# Patient Record
Sex: Female | Born: 1990 | Race: Black or African American | Hispanic: No | Marital: Single | Smoking: Never smoker
Health system: Southern US, Community
[De-identification: ages and names within clinical notes are randomized; demographics above are authoritative.]

## PROBLEM LIST (undated history)

## (undated) DIAGNOSIS — E079 Disorder of thyroid, unspecified: Secondary | ICD-10-CM

## (undated) HISTORY — PX: TONSILLECTOMY: SUR1361

---

## 2013-07-19 HISTORY — PX: LAPAROSCOPY WITH TUBAL LIGATION: SHX5576

## 2015-08-16 ENCOUNTER — Emergency Department
Admission: EM | Admit: 2015-08-16 | Discharge: 2015-08-16 | Disposition: A | Discharge status: Home / Self Care | Payer: Medicaid Other | Attending: Emergency Medicine | Admitting: Emergency Medicine

## 2015-08-16 ENCOUNTER — Encounter: Payer: Self-pay | Admitting: Emergency Medicine

## 2015-08-16 ENCOUNTER — Emergency Department: Payer: Medicaid Other

## 2015-08-16 DIAGNOSIS — F17200 Nicotine dependence, unspecified, uncomplicated: Secondary | ICD-10-CM | POA: Diagnosis not present

## 2015-08-16 DIAGNOSIS — N939 Abnormal uterine and vaginal bleeding, unspecified: Secondary | ICD-10-CM

## 2015-08-16 DIAGNOSIS — Z3490 Encounter for supervision of normal pregnancy, unspecified, unspecified trimester: Secondary | ICD-10-CM

## 2015-08-16 DIAGNOSIS — O209 Hemorrhage in early pregnancy, unspecified: Secondary | ICD-10-CM | POA: Insufficient documentation

## 2015-08-16 DIAGNOSIS — Z3A00 Weeks of gestation of pregnancy not specified: Secondary | ICD-10-CM | POA: Insufficient documentation

## 2015-08-16 HISTORY — DX: Disorder of thyroid, unspecified: E07.9

## 2015-08-16 LAB — COMPREHENSIVE METABOLIC PANEL
ALT: 13 U/L — ABNORMAL LOW (ref 14–54)
ANION GAP: 10 (ref 5–15)
AST: 18 U/L (ref 15–41)
Albumin: 4.2 g/dL (ref 3.5–5.0)
Alkaline Phosphatase: 47 U/L (ref 38–126)
BUN: 7 mg/dL (ref 6–20)
CHLORIDE: 107 mmol/L (ref 101–111)
CO2: 23 mmol/L (ref 22–32)
Calcium: 9.2 mg/dL (ref 8.9–10.3)
Creatinine, Ser: 0.41 mg/dL — ABNORMAL LOW (ref 0.44–1.00)
GFR calc Af Amer: >60 mL/min (ref >60)
Glucose, Bld: 88 mg/dL (ref 65–99)
POTASSIUM: 3.6 mmol/L (ref 3.5–5.1)
Sodium: 140 mmol/L (ref 135–145)
Total Bilirubin: 1 mg/dL (ref 0.3–1.2)
Total Protein: 7.2 g/dL (ref 6.5–8.1)

## 2015-08-16 LAB — CBC
HCT: 41.3 % (ref 35.0–47.0)
Hemoglobin: 14.1 g/dL (ref 12.0–16.0)
MCH: 30.5 pg (ref 26.0–34.0)
MCHC: 34.1 g/dL (ref 32.0–36.0)
MCV: 89.4 fL (ref 80.0–100.0)
PLATELETS: 151 10*3/uL (ref 150–440)
RBC: 4.62 MIL/uL (ref 3.80–5.20)
RDW: 13 % (ref 11.5–14.5)
WBC: 7.1 10*3/uL (ref 3.6–11.0)

## 2015-08-16 LAB — HCG, QUANTITATIVE, PREGNANCY: HCG, BETA CHAIN, QUANT, S: 678 m[IU]/mL — AB (ref <5)

## 2015-08-16 LAB — ABO/RH: ABO/RH(D): B POS

## 2015-08-16 LAB — POCT PREGNANCY, URINE: PREG TEST UR: POSITIVE — AB

## 2015-08-16 NOTE — ED Notes (Signed)
Reports her period is past due and is having very dark bloody discharge.  States her tubes are tied.

## 2015-08-16 NOTE — ED Provider Notes (Signed)
[facility] Emergency Department Provider Note  Time seen: 9:01 AM  I have reviewed the triage vital signs and the nursing notes.   HISTORY  Chief Complaint Vaginal Bleeding    HPI Vickie Francis is a 25 y.o. female with no past medical history who presents to the emergency department with dark vaginal bleeding. According to the patient her period was approximately one week late, it is normally dark and then transition to a brighter red color. Patient states it is been very dark blood with clots during this period. She denies any pain, abdominal pain, vaginal pain, vaginal discharge. Is in a monogamous relationship but denies any possibility of STD. Patient had her tubes tied approximately 2 years ago. She has had 5 children. Denies fever, dysuria.     Past Medical History  Diagnosis Date  . Thyroid disease     There are no active problems to display for this patient.   Past Surgical History  Procedure Laterality Date  . Tonsillectomy      No current outpatient prescriptions on file.  Allergies Morphine and related  History reviewed. No pertinent family history.  Social History Social History  Substance Use Topics  . Smoking status: Current Every Day Smoker  . Smokeless tobacco: None  . Alcohol Use: None    Review of Systems Constitutional: Negative for fever. Cardiovascular: Negative for chest pain. Respiratory: Negative for shortness of breath. Gastrointestinal: Negative for abdominal pain Genitourinary: Negative for dysuria. Positive for dark vaginal bleeding Musculoskeletal: Negative for back pain Neurological: Negative for headache 10-point ROS otherwise negative.  ____________________________________________   PHYSICAL EXAM:  VITAL SIGNS: ED Triage Vitals  Enc Vitals Group     BP 08/16/15 0844 126/74 mmHg     Pulse Rate 08/16/15 0844 83     Resp 08/16/15 0844 16     Temp 08/16/15 0844 98.2 F (36.8 C)     Temp Source  08/16/15 0844 Oral     SpO2 08/16/15 0844 100 %     Weight 08/16/15 0844 126 lb (57.153 kg)     Height 08/16/15 0844 5' (1.524 m)     Head Cir --      Peak Flow --      Pain Score --      Pain Loc --      Pain Edu? --      Excl. in GC? --     Constitutional: Alert and oriented. Well appearing and in no distress. Eyes: Normal exam ENT   Head: Normocephalic and atraumatic.   Mouth/Throat: Mucous membranes are moist. Cardiovascular: Normal rate, regular rhythm. No murmur Respiratory: Normal respiratory effort without tachypnea nor retractions. Breath sounds are clear  Gastrointestinal: Soft and nontender. No distention.  Musculoskeletal: Nontender with normal range of motion in all extremities. Neurologic:  Normal speech and language. No gross focal neurologic deficits Skin:  Skin is warm, dry and intact.  Psychiatric: Mood and affect are normal. Speech and behavior are normal.  ____________________________________________    INITIAL IMPRESSION / ASSESSMENT AND PLAN / ED COURSE  Pertinent labs & imaging results that were available during my care of the patient were reviewed by me and considered in my medical decision making (see chart for details).  Patient presents with dark or vaginal bleeding for the past week, which was approximately one week late. Patient denies any discomfort, states the only reason she came for evaluation was because of the darker color of the bleeding which is atypical for her. We  will perform a pelvic exam, check a urine pregnancy test. Patient agreeable to plan.  Urine pregnancy test positive 2. We'll obtain blood work to confirm.  Labs show an elevated beta hCG of 678. Patient's pelvic exam shows a small amount of dark bleeding. No clots. No adnexal or cervical motion tenderness. Labs are otherwise within normal limits including H&H. We will obtain an ultrasound help further evaluate and then consult OB/GYN.  Patient's ultrasound showed no IUP.  I discussed the patient with Dr. Jean Rosenthal of [facility], he states have the patient follow-up on Monday in the office for repeat beta ECG testing. I discussed this with the patient as well as strict return precautions for any abdominal pain, and the patient is agreeable.  ____________________________________________   FINAL CLINICAL IMPRESSION(S) / ED DIAGNOSES  Pregnancy Vaginal bleeding    Minna Antis, MD 08/16/15 1355

## 2015-08-16 NOTE — Discharge Instructions (Signed)
You have been seen in the emergency department today for vaginal bleeding and a possible pregnancy. As we discussed it is very important he follow-up with Dr. Jean Rosenthal on Monday in the office for repeat beta hCG. Her beta hCG today is 675. Please return to the emergency department immediately if you develop any abdominal pain, or any other symptom personally concerning to your self.   Dysfunctional Uterine Bleeding Dysfunctional uterine bleeding is abnormal bleeding from the uterus. Dysfunctional uterine bleeding includes:  A period that comes earlier or later than usual.  A period that is lighter, heavier, or has blood clots.  Bleeding between periods.  Skipping one or more periods.  Bleeding after sexual intercourse.  Bleeding after menopause. HOME CARE INSTRUCTIONS  Pay attention to any changes in your symptoms. Follow these instructions to help with your condition: Eating  Eat well-balanced meals. Include foods that are high in iron, such as liver, meat, shellfish, green leafy vegetables, and eggs.  If you become constipated:  Drink plenty of water.  Eat fruits and vegetables that are high in water and fiber, such as spinach, carrots, raspberries, apples, and mango. Medicines  Take over-the-counter and prescription medicines only as told by your health care provider.  Do not change medicines without talking with your health care provider.  Aspirin or medicines that contain aspirin may make the bleeding worse. Do not take those medicines:  During the week before your period.  During your period.  If you were prescribed iron pills, take them as told by your health care provider. Iron pills help to replace iron that your body loses because of this condition. Activity  If you need to change your sanitary pad or tampon more than one time every 2 hours:  Lie in bed with your feet raised (elevated).  Place a cold pack on your lower abdomen.  Rest as much as possible until  the bleeding stops or slows down.  Do not try to lose weight until the bleeding has stopped and your blood iron level is back to normal. Other Instructions  For two months, write down:  When your period starts.  When your period ends.  When any abnormal bleeding occurs.  What problems you notice.  Keep all follow up visits as told by your health care provider. This is important. SEEK MEDICAL CARE IF:  You get light-headed or weak.  You have nausea and vomiting.  You cannot eat or drink without vomiting.  You feel dizzy or have diarrhea while you are taking medicines.  You are taking birth control pills or hormones, and you want to change them or stop taking them. SEEK IMMEDIATE MEDICAL CARE IF:  You develop a fever or chills.  You need to change your sanitary pad or tampon more than one time per hour.  Your bleeding becomes heavier, or your flow contains clots more often.  You develop pain in your abdomen.  You lose consciousness.  You develop a rash.   This information is not intended to replace advice given to you by your health care provider. Make sure you discuss any questions you have with your health care provider.   Document Released: 07/02/2000 Document Revised: 03/26/2015 Document Reviewed: 09/30/2014 Elsevier Interactive Patient Education [employer]  First Trimester of Pregnancy The first trimester of pregnancy is from week 1 until the end of week 12 (months 1 through 3). During this time, your baby will begin to develop inside you. At 6-8 weeks, the eyes and face are  formed, and the heartbeat can be seen on ultrasound. At the end of 12 weeks, all the baby's organs are formed. Prenatal care is all the medical care you receive before the birth of your baby. Make sure you get good prenatal care and follow all of your doctor's instructions. HOME CARE  Medicines  Take medicine only as told by your doctor. Some medicines are safe and some are not  during pregnancy.  Take your prenatal vitamins as told by your doctor.  Take medicine that helps you poop (stool softener) as needed if your doctor says it is okay. Diet  Eat regular, healthy meals.  Your doctor will tell you the amount of weight gain that is right for you.  Avoid raw meat and uncooked cheese.  If you feel sick to your stomach (nauseous) or throw up (vomit):  Eat 4 or 5 small meals a day instead of 3 large meals.  Try eating a few soda crackers.  Drink liquids between meals instead of during meals.  If you have a hard time pooping (constipation):  Eat high-fiber foods like fresh vegetables, fruit, and whole grains.  Drink enough fluids to keep your pee (urine) clear or pale yellow. Activity and Exercise  Exercise only as told by your doctor. Stop exercising if you have cramps or pain in your lower belly (abdomen) or low back.  Try to avoid standing for long periods of time. Move your legs often if you must stand in one place for a long time.  Avoid heavy lifting.  Wear low-heeled shoes. Sit and stand up straight.  You can have sex unless your doctor tells you not to. Relief of Pain or Discomfort  Wear a good support bra if your breasts are sore.  Take warm water baths (sitz baths) to soothe pain or discomfort caused by hemorrhoids. Use hemorrhoid cream if your doctor says it is okay.  Rest with your legs raised if you have leg cramps or low back pain.  Wear support hose if you have puffy, bulging veins (varicose veins) in your legs. Raise (elevate) your feet for 15 minutes, 3-4 times a day. Limit salt in your diet. Prenatal Care  Schedule your prenatal visits by the twelfth week of pregnancy.  Write down your questions. Take them to your prenatal visits.  Keep all your prenatal visits as told by your doctor. Safety  Wear your seat belt at all times when driving.  Make a list of emergency phone numbers. The list should include numbers for  family, friends, the hospital, and police and fire departments. General Tips  Ask your doctor for a referral to a local prenatal class. Begin classes no later than at the start of month 6 of your pregnancy.  Ask for help if you need counseling or help with nutrition. Your doctor can give you advice or tell you where to go for help.  Do not use hot tubs, steam rooms, or saunas.  Do not douche or use tampons or scented sanitary pads.  Do not cross your legs for long periods of time.  Avoid litter boxes and soil used by cats.  Avoid all smoking, herbs, and alcohol. Avoid drugs not approved by your doctor.  Do not use any tobacco products, including cigarettes, chewing tobacco, and electronic cigarettes. If you need help quitting, ask your doctor. You may get counseling or other support to help you quit.  Visit your dentist. At home, brush your teeth with a soft toothbrush. Be gentle when you  floss. GET HELP IF:  You are dizzy.  You have mild cramps or pressure in your lower belly.  You have a nagging pain in your belly area.  You continue to feel sick to your stomach, throw up, or have watery poop (diarrhea).  You have a bad smelling fluid coming from your vagina.  You have pain with peeing (urination).  You have increased puffiness (swelling) in your face, hands, legs, or ankles. GET HELP RIGHT AWAY IF:   You have a fever.  You are leaking fluid from your vagina.  You have spotting or bleeding from your vagina.  You have very bad belly cramping or pain.  You gain or lose weight rapidly.  You throw up blood. It may look like coffee grounds.  You are around people who have [place] measles, fifth disease, or chickenpox.  You have a very bad headache.  You have shortness of breath.  You have any kind of trauma, such as from a fall or a car accident.   This information is not intended to replace advice given to you by your health care provider. Make sure you discuss  any questions you have with your health care provider.   Document Released: 12/22/2007 Document Revised: 07/26/2014 Document Reviewed: 05/15/2013 Elsevier Interactive Patient Education [employer]

## 2015-08-16 NOTE — ED Notes (Signed)
Pt transported to ultrasound.

## 2015-08-19 ENCOUNTER — Telehealth: Payer: Self-pay | Admitting: Emergency Medicine

## 2015-08-19 NOTE — ED Notes (Signed)
Pt called and was concerned that she did not get appt with [facility] until Thursday.  i called the office and they will send a message to the md to ask if pt needs to come in for serum hcg level sooner than that.  They willc all the pt.  i called the pt and explained that the doctors will review her information, including her history of btl and willcall her back.

## 2015-08-21 ENCOUNTER — Encounter: Payer: Self-pay | Admitting: Obstetrics and Gynecology

## 2015-08-24 ENCOUNTER — Emergency Department
Admission: EM | Admit: 2015-08-24 | Discharge: 2015-08-25 | Disposition: A | Discharge status: Home / Self Care | Payer: Medicaid Other | Source: Home / Self Care | Attending: Emergency Medicine | Admitting: Emergency Medicine

## 2015-08-24 ENCOUNTER — Encounter: Payer: Self-pay | Admitting: *Deleted

## 2015-08-24 DIAGNOSIS — Z792 Long term (current) use of antibiotics: Secondary | ICD-10-CM | POA: Insufficient documentation

## 2015-08-24 DIAGNOSIS — O99331 Smoking (tobacco) complicating pregnancy, first trimester: Secondary | ICD-10-CM | POA: Insufficient documentation

## 2015-08-24 DIAGNOSIS — R079 Chest pain, unspecified: Secondary | ICD-10-CM

## 2015-08-24 DIAGNOSIS — O080 Genital tract and pelvic infection following ectopic and molar pregnancy: Secondary | ICD-10-CM | POA: Insufficient documentation

## 2015-08-24 DIAGNOSIS — N719 Inflammatory disease of uterus, unspecified: Secondary | ICD-10-CM

## 2015-08-24 DIAGNOSIS — R1030 Lower abdominal pain, unspecified: Secondary | ICD-10-CM

## 2015-08-24 DIAGNOSIS — O210 Mild hyperemesis gravidarum: Secondary | ICD-10-CM

## 2015-08-24 DIAGNOSIS — Z79899 Other long term (current) drug therapy: Secondary | ICD-10-CM | POA: Diagnosis not present

## 2015-08-24 DIAGNOSIS — O001 Tubal pregnancy without intrauterine pregnancy: Secondary | ICD-10-CM | POA: Diagnosis not present

## 2015-08-24 DIAGNOSIS — K661 Hemoperitoneum: Secondary | ICD-10-CM | POA: Diagnosis not present

## 2015-08-24 DIAGNOSIS — F17200 Nicotine dependence, unspecified, uncomplicated: Secondary | ICD-10-CM

## 2015-08-24 DIAGNOSIS — R109 Unspecified abdominal pain: Secondary | ICD-10-CM | POA: Diagnosis present

## 2015-08-24 DIAGNOSIS — Z885 Allergy status to narcotic agent status: Secondary | ICD-10-CM | POA: Diagnosis not present

## 2015-08-24 DIAGNOSIS — O009 Unspecified ectopic pregnancy without intrauterine pregnancy: Secondary | ICD-10-CM

## 2015-08-24 DIAGNOSIS — E079 Disorder of thyroid, unspecified: Secondary | ICD-10-CM | POA: Diagnosis not present

## 2015-08-24 LAB — COMPREHENSIVE METABOLIC PANEL
ALBUMIN: 4.2 g/dL (ref 3.5–5.0)
ALT: 14 U/L (ref 14–54)
AST: 18 U/L (ref 15–41)
Alkaline Phosphatase: 49 U/L (ref 38–126)
Anion gap: 7 (ref 5–15)
BILIRUBIN TOTAL: 0.7 mg/dL (ref 0.3–1.2)
BUN: 10 mg/dL (ref 6–20)
CHLORIDE: 105 mmol/L (ref 101–111)
CO2: 28 mmol/L (ref 22–32)
CREATININE: 0.58 mg/dL (ref 0.44–1.00)
Calcium: 8.9 mg/dL (ref 8.9–10.3)
GFR calc Af Amer: >60 mL/min (ref >60)
GFR calc non Af Amer: >60 mL/min (ref >60)
GLUCOSE: 91 mg/dL (ref 65–99)
POTASSIUM: 3.5 mmol/L (ref 3.5–5.1)
Sodium: 140 mmol/L (ref 135–145)
TOTAL PROTEIN: 6.8 g/dL (ref 6.5–8.1)

## 2015-08-24 LAB — LIPASE, BLOOD: Lipase: 30 U/L (ref 11–51)

## 2015-08-24 LAB — URINALYSIS COMPLETE WITH MICROSCOPIC (ARMC ONLY)
BACTERIA UA: NONE SEEN
Bilirubin Urine: NEGATIVE
Glucose, UA: NEGATIVE mg/dL
HGB URINE DIPSTICK: NEGATIVE
KETONES UR: NEGATIVE mg/dL
Leukocytes, UA: NEGATIVE
Nitrite: NEGATIVE
Protein, ur: 30 mg/dL — AB
SPECIFIC GRAVITY, URINE: 1.026 (ref 1.005–1.030)
pH: 7 (ref 5.0–8.0)

## 2015-08-24 LAB — CBC
HEMATOCRIT: 39.4 % (ref 35.0–47.0)
HEMOGLOBIN: 13.5 g/dL (ref 12.0–16.0)
MCH: 31 pg (ref 26.0–34.0)
MCHC: 34.1 g/dL (ref 32.0–36.0)
MCV: 90.9 fL (ref 80.0–100.0)
Platelets: 187 10*3/uL (ref 150–440)
RBC: 4.34 MIL/uL (ref 3.80–5.20)
RDW: 13.3 % (ref 11.5–14.5)
WBC: 7.9 10*3/uL (ref 3.6–11.0)

## 2015-08-24 NOTE — ED Provider Notes (Signed)
[facility] Emergency Department Provider Note  ____________________________________________  Time seen: Approximately 11:51 PM  I have reviewed the triage vital signs and the nursing notes.   HISTORY  Chief Complaint Abdominal Pain and Dysuria    HPI Vickie Francis is a 25 y.o. female who comes into the hospital today with abdominal pain. The patient reports that started tonight down at the bottom of her abdomen.The patient reports that she has not taken anything for the pain. She reports is not cramping but feels tight and like this pulling. The patient was recently seen here for bleeding and a miscarriage. The patient reports that when she was seen here her pregnancy hormone was 675 when she saw [facility] last week she was told it was 0. She reports that she thinks she had completed the miscarriage as she had passed a large clot on 2022/12/12. She reports that her bleeding stopped today. She reports that she has mid abdomen and left sided abdominal pain. She rates her pain a 9 out of 10 in intensity. She has had some nausea and some vomiting but denies any fevers or chills. The patient reports that today she did have some chest pain while at work but no shortness of breath. She reports she's never had the chest pain in the past. The patient decided to come in and get checked out when the pain continued and was not improving.   Past Medical History  Diagnosis Date  . Thyroid disease     There are no active problems to display for this patient.   Past Surgical History  Procedure Laterality Date  . Tonsillectomy      Current Outpatient Rx  Name  Route  Sig  Dispense  Refill  . clindamycin (CLEOCIN) 300 MG capsule   Oral   Take 1 capsule (300 mg total) by mouth 3 (three) times daily.   30 capsule   0   . oxyCODONE-acetaminophen (ROXICET) 5-325 MG tablet   Oral   Take 1 tablet by mouth every 6 (six) hours as needed.   12 tablet   0      Allergies Morphine and related  History reviewed. No pertinent family history.  Social History Social History  Substance Use Topics  . Smoking status: Current Every Day Smoker  . Smokeless tobacco: None  . Alcohol Use: None    Review of Systems Constitutional: No fever/chills Eyes: No visual changes. ENT: No sore throat. Cardiovascular:  chest pain. Respiratory: Denies shortness of breath. Gastrointestinal: abdominal pain, nausea, vomiting.  No diarrhea.  No constipation. Genitourinary: Negative for dysuria. Musculoskeletal: Negative for back pain. Skin: Negative for rash. Neurological: Negative for headaches, focal weakness or numbness.  10-point ROS otherwise negative.  ____________________________________________   PHYSICAL EXAM:  VITAL SIGNS: ED Triage Vitals  Enc Vitals Group     BP 08/24/15 2245 117/89 mmHg     Pulse Rate 08/24/15 2245 93     Resp 08/24/15 2245 20     Temp 08/24/15 2245 98 F (36.7 C)     Temp Source 08/24/15 2245 Oral     SpO2 08/24/15 2245 99 %     Weight 08/24/15 2245 124 lb (56.246 kg)     Height 08/24/15 2245 5' (1.524 m)     Head Cir --      Peak Flow --      Pain Score 08/24/15 2246 9     Pain Loc --      Pain Edu? --  Excl. in GC? --     Constitutional: Alert and oriented. Well appearing and in moderate distress. Eyes: Conjunctivae are normal. PERRL. EOMI. Head: Atraumatic. Nose: No congestion/rhinnorhea. Mouth/Throat: Mucous membranes are moist.  Oropharynx non-erythematous. Cardiovascular: Normal rate, regular rhythm. Grossly normal heart sounds.  Good peripheral circulation. Respiratory: Normal respiratory effort.  No retractions. Lungs CTAB. Gastrointestinal: Soft with lower abd tenderness to palpation. No distention. Positive bowel sounds Genitourinary: normal external genitalia, scant blood in vault, firm non tender cervix, left adnexal and uterine tenderness to palpation Musculoskeletal: No lower extremity  tenderness nor edema.   Neurologic:  Normal speech and language.  Skin:  Skin is warm, dry and intact.  Psychiatric: Mood and affect are normal.   ____________________________________________   LABS (all labs ordered are listed, but only abnormal results are displayed)  Labs Reviewed  WET PREP, GENITAL - Abnormal; Notable for the following:    WBC, Wet Prep HPF POC MANY (*)    All other components within normal limits  HCG, QUANTITATIVE, PREGNANCY - Abnormal; Notable for the following:    hCG, Beta Chain, Quant, S 1312 (*)    All other components within normal limits  URINALYSIS COMPLETEWITH MICROSCOPIC ([facility] ONLY) - Abnormal; Notable for the following:    Color, Urine YELLOW (*)    APPearance CLEAR (*)    Protein, ur 30 (*)    Squamous Epithelial / LPF 0-5 (*)    All other components within normal limits  CHLAMYDIA/NGC RT PCR ([facility] ONLY)  LIPASE, BLOOD  COMPREHENSIVE METABOLIC PANEL  CBC  TROPONIN I   ____________________________________________  EKG  ED ECG REPORT I, Rebecka Apley, the attending physician, personally viewed and interpreted this ECG.   Date: 08/25/2015  EKG Time: 0025  Rate: 73  Rhythm: normal sinus rhythm  Axis: normal  Intervals:none  ST&T Change: none  ____________________________________________  RADIOLOGY  US pelvis: No intrauterine gestational sac seen, no evidence for ectopic pregnancy, this is unusual given the quantitative beta hCG level of 1312. Would follow-up quantitative beta hCG level. ____________________________________________   PROCEDURES  Procedure(s) performed: None  Critical Care performed: No  ____________________________________________   INITIAL IMPRESSION / ASSESSMENT AND PLAN / ED COURSE  Pertinent labs & imaging results that were available during my care of the patient were reviewed by me and considered in my medical decision making (see chart for details).  This is a 25 year old female who  comes in to the hospital today with abdominal pain. The patient did receive some Percocet for her pain. The patient did have an elevated quantitative beta hCG level which was higher than it was previously. After receiving the results of her ultrasound and blood work I contacted Dr. Bonney Aid who noted that the patient's quantitative beta hCG when checked previously was less than 1. He is concerned that there may have been a lab error so he feels the patient needs to follow back up in the office tomorrow to be able to determine if she needs methotrexate. I did talk about the patient's many bacteria as well as possibly giving her some clindamycin for endometritis and he supports that decision. The patient will be discharged home to follow-up with OB/GYN. ____________________________________________   FINAL CLINICAL IMPRESSION(S) / ED DIAGNOSES  Final diagnoses:  Lower abdominal pain  Endometritis  Ectopic pregnancy      Rebecka Apley, MD 08/25/15 [id number]

## 2015-08-24 NOTE — ED Notes (Addendum)
Sudden onset of severe pelvic pain. Pt vomiting x 2. Pt c/o dysuria. Pt recently treated for miscarriage, complicated hx of unexpected pregnancy. Pt had bilateral tubal ligation 09/2013. Pt found to be pregnant 08/16/15. Pt f/u w/ [facility] on 08/21/15. Pt passed what she describes as a large clot that morning prior to f/u at [facility].

## 2015-08-25 ENCOUNTER — Observation Stay
Admission: AD | Admit: 2015-08-25 | Discharge: 2015-08-25 | Disposition: A | Discharge status: Home / Self Care | Payer: Medicaid Other | Source: Ambulatory Visit | Attending: Obstetrics and Gynecology | Admitting: Obstetrics and Gynecology

## 2015-08-25 ENCOUNTER — Encounter
Admission: AD | Disposition: A | Discharge status: Home / Self Care | Payer: Self-pay | Source: Ambulatory Visit | Attending: Obstetrics and Gynecology

## 2015-08-25 ENCOUNTER — Observation Stay: Payer: Medicaid Other | Admitting: Anesthesiology

## 2015-08-25 ENCOUNTER — Emergency Department: Payer: Medicaid Other

## 2015-08-25 ENCOUNTER — Ambulatory Visit: Admit: 2015-08-25 | Payer: Self-pay | Admitting: Obstetrics and Gynecology

## 2015-08-25 DIAGNOSIS — Z885 Allergy status to narcotic agent status: Secondary | ICD-10-CM | POA: Insufficient documentation

## 2015-08-25 DIAGNOSIS — Z79899 Other long term (current) drug therapy: Secondary | ICD-10-CM | POA: Insufficient documentation

## 2015-08-25 DIAGNOSIS — E079 Disorder of thyroid, unspecified: Secondary | ICD-10-CM | POA: Insufficient documentation

## 2015-08-25 DIAGNOSIS — F17200 Nicotine dependence, unspecified, uncomplicated: Secondary | ICD-10-CM | POA: Insufficient documentation

## 2015-08-25 DIAGNOSIS — R109 Unspecified abdominal pain: Secondary | ICD-10-CM | POA: Insufficient documentation

## 2015-08-25 DIAGNOSIS — K661 Hemoperitoneum: Secondary | ICD-10-CM | POA: Insufficient documentation

## 2015-08-25 DIAGNOSIS — O001 Tubal pregnancy without intrauterine pregnancy: Principal | ICD-10-CM | POA: Insufficient documentation

## 2015-08-25 DIAGNOSIS — O3680X0 Pregnancy with inconclusive fetal viability, not applicable or unspecified: Secondary | ICD-10-CM

## 2015-08-25 HISTORY — PX: LAPAROSCOPIC BILATERAL SALPINGECTOMY: SHX5889

## 2015-08-25 HISTORY — PX: DILATION AND CURETTAGE OF UTERUS: SHX78

## 2015-08-25 LAB — CBC
HEMATOCRIT: 39.3 % (ref 35.0–47.0)
HEMOGLOBIN: 13.3 g/dL (ref 12.0–16.0)
MCH: 30.6 pg (ref 26.0–34.0)
MCHC: 33.8 g/dL (ref 32.0–36.0)
MCV: 90.5 fL (ref 80.0–100.0)
Platelets: 183 10*3/uL (ref 150–440)
RBC: 4.34 MIL/uL (ref 3.80–5.20)
RDW: 13.3 % (ref 11.5–14.5)
WBC: 6.5 10*3/uL (ref 3.6–11.0)

## 2015-08-25 LAB — T4, FREE: FREE T4: 0.96 ng/dL (ref 0.61–1.12)

## 2015-08-25 LAB — TROPONIN I

## 2015-08-25 LAB — TYPE AND SCREEN
ABO/RH(D): B POS
Antibody Screen: NEGATIVE

## 2015-08-25 LAB — WET PREP, GENITAL
Clue Cells Wet Prep HPF POC: NONE SEEN
SPERM: NONE SEEN
TRICH WET PREP: NONE SEEN
YEAST WET PREP: NONE SEEN

## 2015-08-25 LAB — CHLAMYDIA/NGC RT PCR (ARMC ONLY)
CHLAMYDIA TR: NOT DETECTED
N GONORRHOEAE: NOT DETECTED

## 2015-08-25 LAB — TSH: TSH: 0.811 u[IU]/mL (ref 0.350–4.500)

## 2015-08-25 LAB — HCG, QUANTITATIVE, PREGNANCY
HCG, BETA CHAIN, QUANT, S: 1591 m[IU]/mL — AB (ref <5)
hCG, Beta Chain, Quant, S: 1312 m[IU]/mL — ABNORMAL HIGH (ref <5)

## 2015-08-25 SURGERY — DILATION AND CURETTAGE
Anesthesia: General | Wound class: Clean Contaminated

## 2015-08-25 MED ORDER — FENTANYL CITRATE (PF) 100 MCG/2ML IJ SOLN
INTRAMUSCULAR | Status: AC
  Administered 2015-08-25: 25 ug via INTRAVENOUS
  Filled 2015-08-25: qty 2

## 2015-08-25 MED ORDER — CLINDAMYCIN HCL 300 MG PO CAPS: 300.0000 mg | ORAL_CAPSULE | Freq: Three times a day (TID) | ORAL | Status: DC

## 2015-08-25 MED ORDER — ACETAMINOPHEN 500 MG PO TABS
1000.0000 mg | ORAL_TABLET | Freq: Four times a day (QID) | ORAL | Status: DC | PRN
Start: 2015-08-25 — End: 2015-08-26

## 2015-08-25 MED ORDER — OXYCODONE-ACETAMINOPHEN 5-325 MG PO TABS
1.0000 | ORAL_TABLET | Freq: Four times a day (QID) | ORAL | Status: DC | PRN
  Administered 2015-08-25: 1 via ORAL
  Filled 2015-08-25: qty 1

## 2015-08-25 MED ORDER — DEXAMETHASONE SODIUM PHOSPHATE 10 MG/ML IJ SOLN
INTRAMUSCULAR | Status: DC | PRN
  Administered 2015-08-25: 10 mg via INTRAVENOUS

## 2015-08-25 MED ORDER — DEXTROSE-NACL 5-0.45 % IV SOLN
INTRAVENOUS | Status: DC
  Administered 2015-08-25: 10:00:00 via INTRAVENOUS

## 2015-08-25 MED ORDER — SIMETHICONE 80 MG PO CHEW: 80.0000 mg | CHEWABLE_TABLET | Freq: Four times a day (QID) | ORAL | Status: DC | PRN

## 2015-08-25 MED ORDER — LACTATED RINGERS IV SOLN
INTRAVENOUS | Status: DC | PRN
  Administered 2015-08-25: 13:00:00 via INTRAVENOUS

## 2015-08-25 MED ORDER — HYDROMORPHONE HCL 1 MG/ML IJ SOLN: 0.5000 mg | INTRAMUSCULAR | Status: DC | PRN

## 2015-08-25 MED ORDER — SODIUM CHLORIDE 0.9 % IV SOLN: INTRAVENOUS | Status: DC

## 2015-08-25 MED ORDER — ONDANSETRON HCL 4 MG/2ML IJ SOLN: 4.0000 mg | Freq: Four times a day (QID) | INTRAMUSCULAR | Status: DC | PRN

## 2015-08-25 MED ORDER — BUPIVACAINE HCL 0.5 % IJ SOLN
INTRAMUSCULAR | Status: DC | PRN
  Administered 2015-08-25: 10 mL

## 2015-08-25 MED ORDER — DOCUSATE SODIUM 100 MG PO CAPS: 100.0000 mg | ORAL_CAPSULE | Freq: Two times a day (BID) | ORAL | Status: AC

## 2015-08-25 MED ORDER — BUPIVACAINE HCL (PF) 0.5 % IJ SOLN
INTRAMUSCULAR | Status: AC
  Filled 2015-08-25: qty 30

## 2015-08-25 MED ORDER — DOXYCYCLINE HYCLATE 100 MG IV SOLR
100.0000 mg | Freq: Once | INTRAVENOUS | Status: AC
  Administered 2015-08-25: 100 mg via INTRAVENOUS
  Filled 2015-08-25: qty 100

## 2015-08-25 MED ORDER — MIDAZOLAM HCL 2 MG/2ML IJ SOLN
INTRAMUSCULAR | Status: DC | PRN
  Administered 2015-08-25: 2 mg via INTRAVENOUS

## 2015-08-25 MED ORDER — LIDOCAINE HCL (CARDIAC) 20 MG/ML IV SOLN
INTRAVENOUS | Status: DC | PRN
  Administered 2015-08-25: 30 mg via INTRAVENOUS

## 2015-08-25 MED ORDER — CLINDAMYCIN HCL 150 MG PO CAPS
300.0000 mg | ORAL_CAPSULE | Freq: Once | ORAL | Status: AC
  Administered 2015-08-25: 300 mg via ORAL
  Filled 2015-08-25: qty 2

## 2015-08-25 MED ORDER — SILVER NITRATE-POT NITRATE 75-25 % EX MISC
CUTANEOUS | Status: AC
  Filled 2015-08-25: qty 7

## 2015-08-25 MED ORDER — FENTANYL CITRATE (PF) 100 MCG/2ML IJ SOLN
25.0000 ug | INTRAMUSCULAR | Status: DC | PRN
  Administered 2015-08-25 (×4): 25 ug via INTRAVENOUS

## 2015-08-25 MED ORDER — ONDANSETRON HCL 4 MG PO TABS: 4.0000 mg | ORAL_TABLET | Freq: Four times a day (QID) | ORAL | Status: DC | PRN

## 2015-08-25 MED ORDER — SUGAMMADEX SODIUM 200 MG/2ML IV SOLN
INTRAVENOUS | Status: DC | PRN
  Administered 2015-08-25: 112.4 mg via INTRAVENOUS

## 2015-08-25 MED ORDER — KETOROLAC TROMETHAMINE 30 MG/ML IJ SOLN
INTRAMUSCULAR | Status: DC | PRN
  Administered 2015-08-25: 30 mg via INTRAVENOUS

## 2015-08-25 MED ORDER — PROPOFOL 10 MG/ML IV BOLUS
INTRAVENOUS | Status: DC | PRN
  Administered 2015-08-25: 150 mg via INTRAVENOUS

## 2015-08-25 MED ORDER — SILVER NITRATE-POT NITRATE 75-25 % EX MISC
CUTANEOUS | Status: DC | PRN
  Administered 2015-08-25: 7

## 2015-08-25 MED ORDER — ONDANSETRON HCL 4 MG/2ML IJ SOLN
4.0000 mg | Freq: Four times a day (QID) | INTRAMUSCULAR | Status: DC | PRN
Start: 2015-08-25 — End: 2015-08-25

## 2015-08-25 MED ORDER — FENTANYL CITRATE (PF) 100 MCG/2ML IJ SOLN
INTRAMUSCULAR | Status: DC | PRN
  Administered 2015-08-25 (×2): 50 ug via INTRAVENOUS

## 2015-08-25 MED ORDER — ONDANSETRON HCL 4 MG/2ML IJ SOLN
INTRAMUSCULAR | Status: AC
  Administered 2015-08-25: 4 mg via INTRAVENOUS
  Filled 2015-08-25: qty 2

## 2015-08-25 MED ORDER — ONDANSETRON HCL 4 MG/2ML IJ SOLN
INTRAMUSCULAR | Status: DC | PRN
  Administered 2015-08-25: 4 mg via INTRAVENOUS

## 2015-08-25 MED ORDER — OXYCODONE-ACETAMINOPHEN 5-325 MG PO TABS: 1.0000 | ORAL_TABLET | Freq: Four times a day (QID) | ORAL | Status: AC | PRN

## 2015-08-25 MED ORDER — ROCURONIUM BROMIDE 100 MG/10ML IV SOLN
INTRAVENOUS | Status: DC | PRN
  Administered 2015-08-25: 20 mg via INTRAVENOUS
  Administered 2015-08-25: 30 mg via INTRAVENOUS

## 2015-08-25 MED ORDER — LIDOCAINE HCL (PF) 1 % IJ SOLN
INTRAMUSCULAR | Status: AC
  Filled 2015-08-25: qty 30

## 2015-08-25 MED ORDER — OXYCODONE-ACETAMINOPHEN 5-325 MG PO TABS
1.0000 | ORAL_TABLET | Freq: Once | ORAL | Status: AC
  Administered 2015-08-25: 1 via ORAL
  Filled 2015-08-25: qty 1

## 2015-08-25 MED ORDER — ONDANSETRON HCL 4 MG/2ML IJ SOLN
4.0000 mg | Freq: Once | INTRAMUSCULAR | Status: AC | PRN
  Administered 2015-08-25: 4 mg via INTRAVENOUS

## 2015-08-25 MED ORDER — INFLUENZA VAC SPLIT QUAD 0.5 ML IM SUSY: 0.5000 mL | PREFILLED_SYRINGE | INTRAMUSCULAR | Status: DC

## 2015-08-25 SURGICAL SUPPLY — 56 items
APPLICATOR COTTON TIP 6IN STRL (MISCELLANEOUS) ×4 IMPLANT
BAG URO DRAIN 2000ML W/SPOUT (MISCELLANEOUS) ×4 IMPLANT
BLADE SURG SZ11 CARB STEEL (BLADE) ×4 IMPLANT
CANISTER SUCT 1200ML W/VALVE (MISCELLANEOUS) ×4 IMPLANT
CATH FOLEY 2WAY  5CC 16FR (CATHETERS) ×2
CATH ROBINSON RED A/P 16FR (CATHETERS) IMPLANT
CATH URTH 16FR FL 2W BLN LF (CATHETERS) ×2 IMPLANT
CHLORAPREP W/TINT 26ML (MISCELLANEOUS) ×4 IMPLANT
CORD MONOPOLAR M/FML 12FT (MISCELLANEOUS) ×4 IMPLANT
DRAPE LEGGINS SURG 28X43 STRL (DRAPES) ×4 IMPLANT
DRESSING SURGICEL FIBRLLR 1X2 (HEMOSTASIS) IMPLANT
DRSG SURGICEL FIBRILLAR 1X2 (HEMOSTASIS)
DRSG TEGADERM 2-3/8X2-3/4 SM (GAUZE/BANDAGES/DRESSINGS) ×4 IMPLANT
ELECT REM PT RETURN 9FT ADLT (ELECTROSURGICAL) ×4
ELECTRODE REM PT RTRN 9FT ADLT (ELECTROSURGICAL) ×2 IMPLANT
GAUZE SPONGE NON-WVN 2X2 STRL (MISCELLANEOUS) ×2 IMPLANT
GLOVE BIO SURGEON STRL SZ7 (GLOVE) ×12 IMPLANT
GLOVE BIOGEL PI IND STRL 7.5 (GLOVE) ×4 IMPLANT
GLOVE BIOGEL PI INDICATOR 7.5 (GLOVE) ×4
GLOVE INDICATOR 7.5 STRL GRN (GLOVE) ×8 IMPLANT
GOWN STRL REUS W/ TWL LRG LVL3 (GOWN DISPOSABLE) ×2 IMPLANT
GOWN STRL REUS W/ TWL XL LVL3 (GOWN DISPOSABLE) ×2 IMPLANT
GOWN STRL REUS W/TWL LRG LVL3 (GOWN DISPOSABLE) ×2
GOWN STRL REUS W/TWL XL LVL3 (GOWN DISPOSABLE) ×2
IRRIGATION STRYKERFLOW (MISCELLANEOUS) ×2 IMPLANT
IRRIGATOR STRYKERFLOW (MISCELLANEOUS) ×4
IV LACTATED RINGERS 1000ML (IV SOLUTION) ×4 IMPLANT
KIT PINK PAD W/HEAD ARE REST (MISCELLANEOUS) ×4
KIT PINK PAD W/HEAD ARM REST (MISCELLANEOUS) ×2 IMPLANT
KIT RM TURNOVER CYSTO AR (KITS) ×4 IMPLANT
LABEL OR SOLS (LABEL) ×4 IMPLANT
LIGASURE BLUNT 5MM 37CM (INSTRUMENTS) ×4 IMPLANT
LIGASURE MARYLAND LAP STAND (ELECTROSURGICAL) IMPLANT
LIQUID BAND (GAUZE/BANDAGES/DRESSINGS) ×4 IMPLANT
NEEDLE SPNL 22GX3.5 QUINCKE BK (NEEDLE) ×4 IMPLANT
NS IRRIG 500ML POUR BTL (IV SOLUTION) ×4 IMPLANT
PACK DNC HYST (MISCELLANEOUS) ×4 IMPLANT
PACK GYN LAPAROSCOPIC (MISCELLANEOUS) ×4 IMPLANT
PAD OB MATERNITY 4.3X12.25 (PERSONAL CARE ITEMS) ×4 IMPLANT
PAD PREP 24X41 OB/GYN DISP (PERSONAL CARE ITEMS) ×4 IMPLANT
POUCH ENDO CATCH 10MM SPEC (MISCELLANEOUS) ×4 IMPLANT
SCISSORS METZENBAUM CVD 33 (INSTRUMENTS) ×4 IMPLANT
SLEEVE ENDOPATH XCEL 5M (ENDOMECHANICALS) ×4 IMPLANT
SPONGE VERSALON 2X2 STRL (MISCELLANEOUS) ×2
SPONGE XRAY 4X4 16PLY STRL (MISCELLANEOUS) ×4 IMPLANT
SUT MNCRL 4-0 (SUTURE)
SUT MNCRL 4-0 27XMFL (SUTURE)
SUT PLAIN 3 0 SH 27IN (SUTURE) ×4 IMPLANT
SUT VICRYL 0 AB UR-6 (SUTURE) ×4 IMPLANT
SUTURE MNCRL 4-0 27XMF (SUTURE) IMPLANT
SYR CONTROL 10ML (SYRINGE) ×4 IMPLANT
SYRINGE 10CC LL (SYRINGE) ×4 IMPLANT
TOWEL OR 17X26 4PK STRL BLUE (TOWEL DISPOSABLE) ×4 IMPLANT
TROCAR BLUNT TIP 12MM OMST12BT (TROCAR) ×4 IMPLANT
TROCAR XCEL NON-BLD 5MMX100MML (ENDOMECHANICALS) ×4 IMPLANT
TUBING INSUFFLATOR HI FLOW (MISCELLANEOUS) ×4 IMPLANT

## 2015-08-25 NOTE — Anesthesia Procedure Notes (Signed)
Procedure Name: Intubation Date/Time: 08/25/2015 1:12 PM Performed by: Omer Jack Pre-anesthesia Checklist: Patient identified, Patient being monitored, Timeout performed, Emergency Drugs available and Suction available Patient Re-evaluated:Patient Re-evaluated prior to inductionOxygen Delivery Method: Circle system utilized Preoxygenation: Pre-oxygenation with 100% oxygen Intubation Type: IV induction Ventilation: Mask ventilation without difficulty Laryngoscope Size: Mac and 3 Grade View: Grade I Tube type: Oral Tube size: 7.0 mm Number of attempts: 1 Placement Confirmation: ETT inserted through vocal cords under direct vision,  positive ETCO2 and breath sounds checked- equal and bilateral Secured at: 21 cm Tube secured with: Tape Dental Injury: Teeth and Oropharynx as per pre-operative assessment

## 2015-08-25 NOTE — Op Note (Signed)
Operative Note   08/25/2015  PRE-OP DIAGNOSIS: Abdominal pain. Abnormal rising beta hcg values. Concern for ectopic pregnancy. Pelvic free fluid on ultrasound. History of BTL   POST-OP DIAGNOSIS: Same. Dilated and congested right fallopian tube. Hemoperitoneum  SURGEON: Surgeon(s) and Role:    * Tolley Bing, MD - Primary  ASSISTANT: None  ANESTHESIA: General and local  PROCEDURE: Dilation and curettage, diagnostic laparoscopy, bilateral salpingectomy  ESTIMATED BLOOD LOSS: 50mL  DRAINS: indwelling foley UOP   TOTAL IV FLUIDS: crystalloid  SPECIMENS: endometrial curettings and bilateral tubes to pathology   VTE PROPHYLAXIS: SCDs to the bilateral lower extremities  ANTIBIOTICS: Doxycycline  IV given pre op  COMPLICATIONS: None  DISPOSITION: PACU - hemodynamically stable.  CONDITION: stable  FINDINGS: Exam under anesthesia revealed a small anteverted uterus. The patient's uterus sounded to 8.5cm. Endometrial curettings had a minimal amount of tissue but showed a gritty texture in all four quadrants at the end of the curettage. EGBUS, vaginal vault and cervix were normal.  On diagnostic laparoscopy, there was approximately 40mL of hemoperitoneum in the lower pelvis.  In the right adnexa, there was a dilated and congested appearing fallopian tube with no gross evidence of rupture. On this right side there was, approximately 2cm from the cornua, what appeared to be flattened/atrophic part of the tube consistent with prior BTL. On the left side, normal appearing tube with also a segment approximately 2cm from the cornua that appeared flattened/atrophic consistent with prior BTL; there was also normal appearing ovaries bilaterally and normal appearing uterus.  Also, normal appearing appendix, liver and stomach edge, no intra abdominal adhesions noted.   PROCEDURE IN DETAIL: The patient was taken to the OR where anesthesia was administed. The patient was positioned in  dorsal lithotomy in the Walker stirrups. The patient was then examined under anesthesia with the above noted findings. The patient was prepped and draped in the normal sterile fashion and foley catheter was placed. A Graves speculum was placed in the vagina and the anterior lip of the cervix was grasped with a single toothed tenaculum and the uterus sounded to 8.5cm and curettage was done without needed to dilate with any dilators, with the above noted findings. Next, a Hulka uterine manipulator was then inserted in the uterus and uterine mobility was found to be satisfactory; the speculum was then removed.  After changing gloves, attention was turned to the patient's abdomen where a 10 mm skin incision was made in the umbilical fold, after injection of local anesthesia. Using the open technique, the abdomen was then entered and a 12mm balloon trocar placed, with the camera introduced noting correct placement. Pneumoperitoneum to was then achieved and the area directly below entry was inspected and no injury noted. The patient was then placed into trendelenburg and a 5mm LLQ and suprapubic port were then placed under direct visualization.   Using the ligasure device, the mesosalpinx under the left tube was serially cauterized and cut. The cornua was then cauterized and a pedicle left before transection and separation from the uterus. This removed from the abdomen and the same procedure done on the right side. The hemoperitoneum was then removed with the suction irrigator and the intra abdominal pressure turned down to and the operative pedicles were noted to be hemostatic. The 5mm ports were removed under direct visualization and the gas was released prior to removal of the balloon trocar. The fascia there was then closed with a figure of eight 0 vicryl stitch, the  subcutaneous above it reapproximated with a figure of eight 2-0 plain gut stitch and the skin closed with 4-0 monocryl in a subcutaneous  fashion and after holding of pressure due to it being somewhat oozy.While this was being done the Hulka was removed and the speculum re inserted and silver nitrate applied to the former hulka and tenaculum sites.  The umbilicus was hemostatic and liquiband applied and then liquidband was applied to the two 5mm port sites.   The foley catheter was removed. The patient tolerated the procedure well. All counts were correct x 2. The patient was transferred to the recovery room awake, alert and breathing independently.  Cornelia Copa MD [facility]  Pager: [phone]

## 2015-08-25 NOTE — H&P (Addendum)
Obstetrics & Gynecology Admission H&P   Date of Admission : 08/25/2015   Primary OBGYN: [facility] Primary Care Provider: [facility]  Reason for Admission: abnormal pregnancy and concern for ectopic  History of Present Illness: Ms. Vickie Francis is a 25 y.o. G8P5. (Patient's last menstrual period was 07/01/2015 (approximate).), with the above CC. PMHx is significant for h/o 2015 interval BTL and ? hyperthyrodism.  Patient initially seen in the ER on 1/28 and had quant of 678 and negative u/s and was discharged to home after consultation with Dr. Jean Rosenthal of GYN. She had an in office beta hcg drawn on 2/1 which was <1 and she was seen on 2/2 by me and d/w her completion of SAB but need for another means of contraception. She was discharged with PRN follow up and pt to consider options and let me know how she'd like to proceed. She came back to the ER last night with abdominal pain and slight bleeding and her quant here was 1312. Her u/s was still negative but she did have some more free fluid in the pelvis (vs trace previously) and her ES was thinner at 2.92mm vs 13mm on 1/28. She was d/w Dr. Bonney Aid and told to follow up in the office. The [facility] front desk called me at 8am today saying the patient was calling the office for an appointment and after looking up her info in EPIC I recommended to the patient to come to [facility] for direct admission and surgery.  The pain has been helped by the percocet that she was discharged to home with (and also clinda for concern for endometritis) but she feels that it's coming back. It's located in the suprapubic and LLQ area and feels like pulling. No VB, chest pain, SOB, fevers, chills, dysuria.  Last PO before MN    ROS:  as stated in the above HPI.  OBGYN History: As per HPI. SVD x 5.   Past Medical History: Past Medical History  Diagnosis Date  . Thyroid disease     ?hyper    Past Surgical History: Past Surgical History  Procedure Laterality Date   . Tonsillectomy    . Laparoscopy with tubal ligation  2015    kleppinger per patient    Family History:  History reviewed. No pertinent family history. She denies any female cancers  Social History:  Social History   Social History  . Marital Status: Single    Spouse Name: N/A  . Number of Children: N/A  . Years of Education: N/A   Occupational History  . Not on file.   Social History Main Topics  . Smoking status: Current Every Day Smoker  . Smokeless tobacco: Not on file  . Alcohol Use: Not on file  . Drug Use: Not on file  . Sexual Activity: Not on file   Other Topics Concern  . Not on file   Social History Narrative     Allergy: Allergies  Allergen Reactions  . Morphine And Related Hives    Current Outpatient Medications: Prescriptions prior to admission  Medication Sig Dispense Refill Last Dose  . clindamycin (CLEOCIN) 300 MG capsule Take 1 capsule (300 mg total) by mouth 3 (three) times daily. 30 capsule 0   . oxyCODONE-acetaminophen (ROXICET) 5-325 MG tablet Take 1 tablet by mouth every 6 (six) hours as needed. 12 tablet 0      Hospital Medications: Current Facility-Administered Medications  Medication Dose Route Frequency Provider Last Rate Last Dose  . dextrose 5 %-  0.45 % sodium chloride infusion   Intravenous Continuous [place] Bing, MD 100 mL/hr at 08/25/15 1015    . HYDROmorphone (DILAUDID) injection 0.5 mg  0.5 mg Intravenous Q2H PRN [place] Bing, MD      . Melene Muller ON 08/26/2015] Influenza vac split quadrivalent PF (FLUARIX) injection 0.5 mL  0.5 mL Intramuscular Tomorrow-1000 [place] Bing, MD      . ondansetron (ZOFRAN) injection 4 mg  4 mg Intravenous Q6H PRN [place] Bing, MD         Physical Exam:  Current Vital Signs 24h Vital Sign Ranges  T 98.4 F (36.9 C) Temp  Avg: 98.2 F (36.8 C)  Min: 98 F (36.7 C)  Max: 98.4 F (36.9 C)  BP (!) 105/49 mmHg BP  Min: 98/65  Max: 117/89  HR (!) 59 Pulse  Avg: 76.7  Min: 59  Max: 93   RR 18 Resp  Avg: 18.7  Min: 18  Max: 20  SaO2     SpO2  Avg: 99 %  Min: 99 %  Max: 99 %       24 Hour I/O Current Shift I/O  Time Ins Outs       General appearance: Well nourished, well developed female in no acute distress.  Cardiovascular:Regular rate and rhythm.  No murmurs, rubs or gallops. Respiratory:  Clear to auscultation bilateral. Normal respiratory effort Abdomen: positive bowel sounds and no masses, hernias; minimally ttp in the lower belly and llq but no peritoneal s/s Neuro/Psych:  Normal mood and affect.  Skin:  Warm and dry.    Pelvic exam: negative on ER exam last night   Laboratory:  Recent Labs Lab 08/24/15 2255 08/25/15 0937  WBC 7.9 6.5  HGB 13.5 13.3  HCT 39.4 39.3  PLT 187 183    Recent Labs Lab 08/24/15 2255  NA 140  K 3.5  CL 105  CO2 28  BUN 10  CREATININE 0.58  CALCIUM 8.9  PROT 6.8  BILITOT 0.7  ALKPHOS 49  ALT 14  AST 18  GLUCOSE 91    Imaging:  As per HPI  Assessment: Ms. Vickie Francis is a 25 y.o. G8P5 with failed BTL and abnormal pregnancy and concern for ectopic; pt is currently stable  Plan: *admit to gyn for obs *abnormal pregnancy: i d/w her that over the course of 8-9 days, she's only had a rise of her beta from 678 to 1312 and that this would indicate that it's an abnormal pregnancy either IUP or ectopic b/c I'd expect her beta in to be in the 3-4k range. Couple this with her history and I recommend proceeding to surgery for D&C and l/s bilateral salpingectomy. Pt amenable to this and r/b/a d/w her and theoretical risk of slightly earlier menopause. I will call the OR and post and proceed after labs are back. Will also repeat quant and cbc and get routine type and screen -I d/w [facility] this AM and they are re running the office sample. *Endo: pt hasn't seen a PCP in several years for her hyperthyroidism so will get TFTs today *FEN/GI: NPO, MIVF *PPx: OOB ad lib *Pain: IV dialudid PRN. Pt unsure of any other narcotics that may  not cause hives. Fentanyl, dilaudid mentioned and pt unsure *Dispo: pt told that if all goes well that can go home after surgery  Total time taking care of the patient was 25 minutes, with greater than 50% of the time spent in face to face interaction with the patient.  Billey Gosling  Lonia Skinner MD [facility] Pager [phone]

## 2015-08-25 NOTE — Progress Notes (Signed)
Pt discharged home with family. Pt v/o full understanding of dis inst.

## 2015-08-25 NOTE — Discharge Instructions (Signed)
[facility] Laparoscopic Surgery Discharge Instructions  Instructions Following Laparoscopic Surgery You have just undergone a major laparoscopic surgery.  The following list should answer your most common questions.  Although we will discuss your surgery and post-operative instructions with you prior to your discharge, this list will serve as a reminder if you fail to recall the details of what we discussed.  We will discuss your surgery once again in detail at your post-op visit in two to four weeks. If you havent already done so, please call to make your appointment as soon as possible.  How you will feel: Although you have just undergone a major surgery, your recovery will be significantly shorter since the surgery was performed through much smaller incisions than the traditional approach.  You should feel slightly better each day.  If you suddenly feel much worse than the prior day, please call the clinic.  Its important during the early part of your recovery that you maintain some activity.  Walking is encouraged.  You will quicken your recovery by continued activity.  Incision:  Your incisions will be closed with dissolvable stitches or surgical adhesive (glue).  There may be Band-aids and/or Steri-strips covering your incisions.  If there is no drainage from the incisions you may remove the Band-aids in one to two days.  You may notice some minor bruising at the incision sites.  This is common and will resolve within several days.  Please inform us if the redness at the edges of your incision appears to be spreading.  If the skin around your incision becomes warm to the touch, or if you notice a pus-like drainage, please call the office.  Vaginal Discharge : Minor vaginal bleeding or spotting is normal. Vaginal spotting may continue for several weeks following your surgery.    Sexual Activity: Do not have sexual intercourse or place tampons or douches in the vagina prior to your first  office visit.  We will discuss when you may resume these activities at that visit.    Stairs/Driving/Activities: You may climb stairs if necessary.  If youve had general anesthesia, do not drive a car the rest of the day today.  You may begin light housework when you feel up to it, but avoid heavy lifting (more than 15-20lbs) or pushing until cleared for these activities by your physician.  Hygiene:  Do not soak your incisions.  Showers are acceptable but you may not take a bath or swim in a pool.  Cleanse your incisions daily with soap and water.  Medications:  Please resume taking any medications that you were taking prior to the surgery.  If we have prescribed any new medications for you, please take them as directed.  Constipation:  It is fairly common to experience some difficulty in moving your bowels following major surgery.  Being active will help to reduce this likelihood. A diet rich in fiber and plenty of liquids is desirable.  If you do become constipated, a mild laxative such as Miralax, Milk of Magnesia, or Metamucil, or a stool softener such as Colace, is recommended.  General Instructions: If you develop a fever of 100.5 degrees or higher, please call the office number(s) below for physician on call.    We will discuss your surgery once again in detail at your post-op visit in two to four weeks. If you havent already done so, please call to make your appointment as soon as possible.  [place] [facility] [place]  [address]  [address]  [place], [place] [address] [place], [place] [address]  Phone: [phone] Phone: [phone]  Fax: [phone] Fax: [phone]

## 2015-08-25 NOTE — ED Notes (Signed)
Pt reports seen here a few days ago with positive blood pregnancy test and then seen in follow up at [facility] and told her test was zero. Pt reports she had passed a large blood clot prior to going to the doctor that day. Pt now having pain to her lower abd and is still having bleeding.

## 2015-08-25 NOTE — Anesthesia Preprocedure Evaluation (Addendum)
Anesthesia Evaluation  Patient identified by MRN, date of birth, ID band Patient awake    Reviewed: Allergy & Precautions, NPO status , Patient's Chart, lab work & pertinent test results, reviewed documented beta blocker date and time   Airway Mallampati: II  TM Distance: >3 FB     Dental  (+) Chipped   Pulmonary Current Smoker,           Cardiovascular      Neuro/Psych    GI/Hepatic   Endo/Other    Renal/GU      Musculoskeletal   Abdominal   Peds  Hematology   Anesthesia Other Findings Tubal preg. EKG OK. U-preg +  Reproductive/Obstetrics                            Anesthesia Physical Anesthesia Plan  ASA: II  Anesthesia Plan: General   Post-op Pain Management:    Induction: Intravenous  Airway Management Planned: Oral ETT  Additional Equipment:   Intra-op Plan:   Post-operative Plan:   Informed Consent: I have reviewed the patients History and Physical, chart, labs and discussed the procedure including the risks, benefits and alternatives for the proposed anesthesia with the patient or authorized representative who has indicated his/her understanding and acceptance.     Plan Discussed with: CRNA  Anesthesia Plan Comments:         Anesthesia Quick Evaluation

## 2015-08-25 NOTE — Transfer of Care (Signed)
Immediate Anesthesia Transfer of Care Note  Patient: Vickie Francis  Procedure(s) Performed: Procedure(s) with comments: DILATATION AND CURETTAGE (N/A) - doxycycline  IV x 1 to be given pre op LAPAROSCOPIC BILATERAL SALPINGECTOMY (Bilateral) - 12mm balloon trocar, 5mm 30degree scope  Patient Location: PACU  Anesthesia Type:General  Level of Consciousness: awake, alert  and oriented  Airway & Oxygen Therapy: Patient Spontanous Breathing and Patient connected to nasal cannula oxygen  Post-op Assessment: Report given to RN and Post -op Vital signs reviewed and stable  Post vital signs: Reviewed and stable  Last Vitals:  Filed Vitals:   08/25/15 1213 08/25/15 1450  BP: 94/63 92/81  Pulse: 48   Temp: 36 C 36.5 C  Resp: 18 16    Complications: No apparent anesthesia complications

## 2015-08-25 NOTE — Discharge Instructions (Signed)
Abdominal Pain, Adult Many things can cause abdominal pain. Usually, abdominal pain is not caused by a disease and will improve without treatment. It can often be observed and treated at home. Your health care provider will do a physical exam and possibly order blood tests and X-rays to help determine the seriousness of your pain. However, in many cases, more time must pass before a clear cause of the pain can be found. Before that point, your health care provider may not know if you need more testing or further treatment. HOME CARE INSTRUCTIONS Monitor your abdominal pain for any changes. The following actions may help to alleviate any discomfort you are experiencing:  Only take over-the-counter or prescription medicines as directed by your health care provider.  Do not take laxatives unless directed to do so by your health care provider.  Try a clear liquid diet (broth, tea, or water) as directed by your health care provider. Slowly move to a bland diet as tolerated. SEEK MEDICAL CARE IF:  You have unexplained abdominal pain.  You have abdominal pain associated with nausea or diarrhea.  You have pain when you urinate or have a bowel movement.  You experience abdominal pain that wakes you in the night.  You have abdominal pain that is worsened or improved by eating food.  You have abdominal pain that is worsened with eating fatty foods.  You have a fever. SEEK IMMEDIATE MEDICAL CARE IF:  Your pain does not go away within 2 hours.  You keep throwing up (vomiting).  Your pain is felt only in portions of the abdomen, such as the right side or the left lower portion of the abdomen.  You pass bloody or black tarry stools. MAKE SURE YOU:  Understand these instructions.  Will watch your condition.  Will get help right away if you are not doing well or get worse.   This information is not intended to replace advice given to you by your health care provider. Make sure you discuss  any questions you have with your health care provider.   Document Released: 04/14/2005 Document Revised: 03/26/2015 Document Reviewed: 03/14/2013 Elsevier Interactive Patient Education 2016 [employer] Inc[employer]  Ectopic Pregnancy An ectopic pregnancy is when the fertilized egg attaches (implants) outside the uterus. Most ectopic pregnancies occur in the fallopian tube. Rarely do ectopic pregnancies occur on the ovary, intestine, pelvis, or cervix. In an ectopic pregnancy, the fertilized egg does not have the ability to develop into a normal, healthy baby.  A ruptured ectopic pregnancy is one in which the fallopian tube gets torn or bursts and results in internal bleeding. Often there is intense abdominal pain, and sometimes, vaginal bleeding. Having an ectopic pregnancy can be life threatening. If left untreated, this dangerous condition can lead to a blood transfusion, abdominal surgery, or even death. CAUSES  Damage to the fallopian tubes is the suspected cause in most ectopic pregnancies.  RISK FACTORS Depending on your circumstances, the risk of having an ectopic pregnancy will vary. The level of risk can be divided into three categories. High Risk  You have gone through infertility treatment.  You have had a previous ectopic pregnancy.  You have had previous tubal surgery.  You have had previous surgery to have the fallopian tubes tied (tubal ligation).  You have tubal problems or diseases.  You have been exposed to DES. DES is a medicine that was used until 1971 and had effects on babies whose mothers took the medicine.  You become pregnant while using  an intrauterine device (IUD) for birth control. Moderate Risk  You have a history of infertility.  You have a history of a sexually transmitted infection (STI).  You have a history of pelvic inflammatory disease (PID).  You have scarring from endometriosis.  You have multiple sexual partners.  You smoke. Low Risk  You have  had previous pelvic surgery.  You use vaginal douching.  You became sexually active before 27 years of age. SIGNS AND SYMPTOMS  An ectopic pregnancy should be suspected in anyone who has missed a period and has abdominal pain or bleeding.  You may experience normal pregnancy symptoms, such as:  Nausea.  Tiredness.  Breast tenderness.  Other symptoms may include:  Pain with intercourse.  Irregular vaginal bleeding or spotting.  Cramping or pain on one side or in the lower abdomen.  Fast heartbeat.  Passing out while having a bowel movement.  Symptoms of a ruptured ectopic pregnancy and internal bleeding may include:  Sudden, severe pain in the abdomen and pelvis.  Dizziness or fainting.  Pain in the shoulder area. DIAGNOSIS  Tests that may be performed include:  A pregnancy test.  An ultrasound test.  Testing the specific level of pregnancy hormone in the bloodstream.  Taking a sample of uterus tissue (dilation and curettage, D&C).  Surgery to perform a visual exam of the inside of the abdomen using a thin, lighted tube with a tiny camera on the end (laparoscope). TREATMENT  An injection of a medicine called methotrexate may be given. This medicine causes the pregnancy tissue to be absorbed. It is given if:  The diagnosis is made early.  The fallopian tube has not ruptured.  You are considered to be a good candidate for the medicine. Usually, pregnancy hormone blood levels are checked after methotrexate treatment. This is to be sure the medicine is effective. It may take 4-6 weeks for the pregnancy to be absorbed (though most pregnancies will be absorbed by 3 weeks). Surgical treatment may be needed. A laparoscope may be used to remove the pregnancy tissue. If severe internal bleeding occurs, a cut (incision) may be made in the lower abdomen (laparotomy), and the ectopic pregnancy is removed. This stops the bleeding. Part of the fallopian tube, or the whole  tube, may be removed as well (salpingectomy). After surgery, pregnancy hormone tests may be done to be sure there is no pregnancy tissue left. You may receive a Rho (D) immune globulin shot if you are Rh negative and the father is Rh positive, or if you do not know the Rh type of the father. This is to prevent problems with any future pregnancy. SEEK IMMEDIATE MEDICAL CARE IF:  You have any symptoms of an ectopic pregnancy. This is a medical emergency. MAKE SURE YOU:  Understand these instructions.  Will watch your condition.  Will get help right away if you are not doing well or get worse.   This information is not intended to replace advice given to you by your health care provider. Make sure you discuss any questions you have with your health care provider.   Document Released: 08/12/2004 Document Revised: 07/26/2014 Document Reviewed: 02/01/2013 Elsevier Interactive Patient Education [employer]

## 2015-08-25 NOTE — Anesthesia Postprocedure Evaluation (Signed)
Anesthesia Post Note  Patient: Vickie Francis  Procedure(s) Performed: Procedure(s) (LRB): DILATATION AND CURETTAGE (N/A) LAPAROSCOPIC BILATERAL SALPINGECTOMY (Bilateral)  Patient location during evaluation: PACU Anesthesia Type: General Level of consciousness: awake and alert Pain management: pain level controlled Vital Signs Assessment: post-procedure vital signs reviewed and stable Respiratory status: spontaneous breathing, nonlabored ventilation, respiratory function stable and patient connected to nasal cannula oxygen Cardiovascular status: blood pressure returned to baseline and stable Postop Assessment: no signs of nausea or vomiting Anesthetic complications: no    Last Vitals:  Filed Vitals:   08/25/15 1530 08/25/15 1600  BP: 109/66 102/60  Pulse: 63 59  Temp: 37.1 C 36.6 C  Resp: 18 18    Last Pain:  Filed Vitals:   08/25/15 1647  PainSc: Asleep                 Lennan Malone S

## 2015-08-25 NOTE — ED Notes (Signed)
Pt reports she was seen here a few days ago and had a positive blood pregnancy test and then seen in follow up at [facility]-

## 2015-08-25 NOTE — ED Notes (Signed)
Patient transported to Ultrasound 

## 2015-08-27 LAB — SURGICAL PATHOLOGY

## 2016-07-22 ENCOUNTER — Emergency Department
Admission: EM | Admit: 2016-07-22 | Discharge: 2016-07-22 | Disposition: A | Discharge status: Home / Self Care | Payer: Medicaid Other | Attending: Student in an Organized Health Care Education/Training Program | Admitting: Student in an Organized Health Care Education/Training Program

## 2016-07-22 ENCOUNTER — Encounter: Payer: Self-pay | Admitting: Emergency Medicine

## 2016-07-22 DIAGNOSIS — R809 Proteinuria, unspecified: Secondary | ICD-10-CM | POA: Diagnosis not present

## 2016-07-22 DIAGNOSIS — Z79899 Other long term (current) drug therapy: Secondary | ICD-10-CM | POA: Insufficient documentation

## 2016-07-22 DIAGNOSIS — R300 Dysuria: Secondary | ICD-10-CM | POA: Diagnosis present

## 2016-07-22 DIAGNOSIS — F17200 Nicotine dependence, unspecified, uncomplicated: Secondary | ICD-10-CM | POA: Insufficient documentation

## 2016-07-22 LAB — URINALYSIS, COMPLETE (UACMP) WITH MICROSCOPIC
Bacteria, UA: NONE SEEN
Glucose, UA: NEGATIVE mg/dL
Hgb urine dipstick: NEGATIVE
KETONES UR: 5 mg/dL — AB
LEUKOCYTES UA: NEGATIVE
Nitrite: NEGATIVE
PROTEIN: 100 mg/dL — AB
Specific Gravity, Urine: 1.035 — ABNORMAL HIGH (ref 1.005–1.030)
pH: 6 (ref 5.0–8.0)

## 2016-07-22 LAB — CHLAMYDIA/NGC RT PCR (ARMC ONLY)
CHLAMYDIA TR: NOT DETECTED
N gonorrhoeae: NOT DETECTED

## 2016-07-22 LAB — WET PREP, GENITAL
CLUE CELLS WET PREP: NONE SEEN
Sperm: NONE SEEN
TRICH WET PREP: NONE SEEN
Yeast Wet Prep HPF POC: NONE SEEN

## 2016-07-22 MED ORDER — PHENAZOPYRIDINE HCL 200 MG PO TABS: 200.0000 mg | ORAL_TABLET | Freq: Three times a day (TID) | ORAL | 0 refills | Status: AC | PRN

## 2016-07-22 NOTE — ED Triage Notes (Signed)
Pt c/o dysuria and foul smell to urine. Denies fevers. Denies pain.

## 2016-07-22 NOTE — ED Provider Notes (Signed)
[facility] Emergency Department Provider Note  ____________________________________________  Time seen: Approximately 5:59 PM  I have reviewed the triage vital signs and the nursing notes.   HISTORY  Chief Complaint Dysuria    HPI Vickie Francis is a 26 y.o. female who presents to the emergency department for evaluation of dysuria and malodorous urine. She denies fevers. She denies abdominal or back pain.She states that she has had vaginal discharge as well for the past month or more.  Past Medical History:  Diagnosis Date  . Thyroid disease    ?hyper    Patient Active Problem List   Diagnosis Date Noted  . Pregnancy of unknown anatomic location 08/25/2015    Past Surgical History:  Procedure Laterality Date  . DILATION AND CURETTAGE OF UTERUS N/A 08/25/2015   Procedure: DILATATION AND CURETTAGE;  Surgeon: [place]ie Bing, MD;  Location: [facility] ORS;  Service: Gynecology;  Laterality: N/A;  doxycycline 100mg  IV x 1 to be given pre op  . LAPAROSCOPIC BILATERAL SALPINGECTOMY Bilateral 08/25/2015   Procedure: LAPAROSCOPIC BILATERAL SALPINGECTOMY;  Surgeon: [place] Bing, MD;  Location: [facility] ORS;  Service: Gynecology;  Laterality: Bilateral;  12mm balloon trocar, 5mm 30degree scope  . LAPAROSCOPY WITH TUBAL LIGATION  2015   kleppinger per patient  . TONSILLECTOMY      Prior to Admission medications   Medication Sig Start Date End Date Taking? Authorizing Provider  docusate sodium (COLACE) 100 MG capsule Take 1 capsule (100 mg total) by mouth 2 (two) times daily. 08/25/15   Caspar Bing, MD  oxyCODONE-acetaminophen (PERCOCET/ROXICET) 5-325 MG tablet Take 1 tablet by mouth every 6 (six) hours as needed for severe pain. 08/25/15   Refugio Bing, MD  oxyCODONE-acetaminophen (ROXICET) 5-325 MG tablet Take 1 tablet by mouth every 6 (six) hours as needed. 08/25/15   Rebecka Apley, MD  phenazopyridine (PYRIDIUM) 200 MG tablet Take 1 tablet (200 mg total) by  mouth 3 (three) times daily as needed for pain. 07/22/16 07/22/17  Chinita Pester, FNP    Allergies Morphine and related  History reviewed. No pertinent family history.  Social History Social History  Substance Use Topics  . Smoking status: Current Every Day Smoker  . Smokeless tobacco: Not on file  . Alcohol use Not on file    Review of Systems Constitutional: Negative for fever. Respiratory: Negative for shortness of breath or cough. Gastrointestinal: Negative for abdominal pain; negative for nausea , negative for vomiting. Genitourinary: Positive for dysuria , positive for vaginal discharge. Musculoskeletal: Negative for back pain. Skin: Negative for lesion or wound. ____________________________________________   PHYSICAL EXAM:  VITAL SIGNS: ED Triage Vitals [07/22/16 1711]  Enc Vitals Group     BP 103/64     Pulse Rate 85     Resp 18     Temp 98.4 F (36.9 C)     Temp Source Oral     SpO2 99 %     Weight 124 lb (56.2 kg)     Height 5' (1.524 m)     Head Circumference      Peak Flow      Pain Score 3     Pain Loc      Pain Edu?      Excl. in GC?     Constitutional: Alert and oriented. Well appearing and in no acute distress. Eyes: Conjunctivae are normal. PERRL. EOMI. Head: Atraumatic. Nose: No congestion/rhinnorhea. Mouth/Throat: Mucous membranes are moist. Respiratory: Normal respiratory effort.  No retractions. Gastrointestinal: Abdomen soft, nontender,  no rebound or guarding. Bowel sounds active and present 4 quadrants. Genitourinary: Pelvic exam: White discharge noted in the vaginal vault and on the cervix. No cervical motion tenderness on exam. No adnexal tenderness on exam. Musculoskeletal: No extremity tenderness nor edema.  Neurologic:  Normal speech and language. No gross focal neurologic deficits are appreciated. Speech is normal. No gait instability. Skin:  Skin is warm, dry and intact. No rash noted. Psychiatric: Mood and affect are normal.  Speech and behavior are normal.  ____________________________________________   LABS (all labs ordered are listed, but only abnormal results are displayed)  Labs Reviewed  WET PREP, GENITAL - Abnormal; Notable for the following:       Result Value   WBC, Wet Prep HPF POC FEW (*)    All other components within normal limits  URINALYSIS, COMPLETE (UACMP) WITH MICROSCOPIC - Abnormal; Notable for the following:    Color, Urine AMBER (*)    APPearance CLEAR (*)    Specific Gravity, Urine 1.035 (*)    Bilirubin Urine SMALL (*)    Ketones, ur 5 (*)    Protein, ur 100 (*)    Squamous Epithelial / LPF 0-5 (*)    All other components within normal limits  CHLAMYDIA/NGC RT PCR ([facility] ONLY)   ____________________________________________  RADIOLOGY  Not indicated ____________________________________________   PROCEDURES  Procedure(s) performed: None  ____________________________________________   INITIAL IMPRESSION / ASSESSMENT AND PLAN / ED COURSE  Clinical Course      Pertinent labs & imaging results that were available during my care of the patient were reviewed by me and considered in my medical decision making (see chart for details).  26 year old female presenting to the emergency department for dysuria vaginal discharge. Today, her exam shows some white vaginal discharge but otherwise is negative. Urinalysis shows ketones and protein, likely related to poor dietary habits. Patient is very thin and reports that she does not eat regularly. She was encouraged to follow up with her primary care provider if the Pyridium does not relieve the dysuria. She was instructed to return to the emergency department for symptoms that change or worsen if she is unable to schedule an appointment. ____________________________________________   FINAL CLINICAL IMPRESSION(S) / ED DIAGNOSES  Final diagnoses:  Dysuria  Proteinuria, unspecified type    Note:  This document was  prepared using Dragon voice recognition software and may include unintentional dictation errors.    Chinita PesterCari B Tomie Spizzirri, FNP 07/22/16 2204    Willy EddyPatrick Robinson, MD 07/23/16 [phone]

## 2016-07-22 NOTE — Discharge Instructions (Signed)
Schedule an appointment with Vickie Francis for a repeat urinalysis in about a week.  Return to the ER for symptoms that change or worsen if unable to schedule an appointment.

## 2016-07-27 ENCOUNTER — Emergency Department
Admission: EM | Admit: 2016-07-27 | Discharge: 2016-07-27 | Disposition: A | Discharge status: Home / Self Care | Payer: Medicaid Other | Attending: Emergency Medicine | Admitting: Emergency Medicine

## 2016-07-27 ENCOUNTER — Encounter: Payer: Self-pay | Admitting: Emergency Medicine

## 2016-07-27 DIAGNOSIS — N760 Acute vaginitis: Secondary | ICD-10-CM | POA: Insufficient documentation

## 2016-07-27 DIAGNOSIS — B9689 Other specified bacterial agents as the cause of diseases classified elsewhere: Secondary | ICD-10-CM

## 2016-07-27 DIAGNOSIS — F17200 Nicotine dependence, unspecified, uncomplicated: Secondary | ICD-10-CM | POA: Diagnosis not present

## 2016-07-27 DIAGNOSIS — N898 Other specified noninflammatory disorders of vagina: Secondary | ICD-10-CM | POA: Diagnosis present

## 2016-07-27 LAB — URINALYSIS, COMPLETE (UACMP) WITH MICROSCOPIC
Bacteria, UA: NONE SEEN
Bilirubin Urine: NEGATIVE
GLUCOSE, UA: NEGATIVE mg/dL
HGB URINE DIPSTICK: NEGATIVE
KETONES UR: NEGATIVE mg/dL
Leukocytes, UA: NEGATIVE
Nitrite: NEGATIVE
PROTEIN: 30 mg/dL — AB
Specific Gravity, Urine: 1.017 (ref 1.005–1.030)
pH: 9 — ABNORMAL HIGH (ref 5.0–8.0)

## 2016-07-27 LAB — WET PREP, GENITAL
Sperm: NONE SEEN
Trich, Wet Prep: NONE SEEN
Yeast Wet Prep HPF POC: NONE SEEN

## 2016-07-27 LAB — CHLAMYDIA/NGC RT PCR (ARMC ONLY)
Chlamydia Tr: NOT DETECTED
N gonorrhoeae: NOT DETECTED

## 2016-07-27 MED ORDER — METRONIDAZOLE 500 MG PO TABS
500.0000 mg | ORAL_TABLET | Freq: Three times a day (TID) | ORAL | 0 refills | Status: AC
Start: 2016-07-27 — End: 2016-08-03

## 2016-07-27 NOTE — ED Triage Notes (Signed)
Patient presents to the ED with vaginal discharge that began on Wednesday.  Patient was seen in the ED for the same on Thursday.  Patient states discharge has increased and odor has become foul.  Patient reports history of BV and states symptoms seem the same although patient tested negative on Thursday.  Patient denies abdominal pain and denies vaginal bleeding.  No obvious distress at this time.

## 2016-07-27 NOTE — ED Provider Notes (Signed)
[facility] Emergency Department Provider Note   ____________________________________________   None    (approximate)  I have reviewed the triage vital signs and the nursing notes.   HISTORY  Chief Complaint Vaginal Discharge   HPI Vickie Francis is a 26 y.o. female complaining of "fishy" smelling vaginal discharge x 5 days. The discharge looks like her normal thin, white discharge, but she is concerned about the smell. Patient reports no pain, but admits to an external, vulvar itch. She has tried a vinegar bath with no relief. Cannot cite anything that makes the itch worse. Last week, she tried new soaps and a "bath bomb," but discontinued these after the itch began. On December 30th, she had unprotected intercourse with a new partner.  Additionally, she visited the ED last Thursday, complaining only of a burning sensation upon urination, was negative for UTI, and the burn has since resolved without any medication. She admits to occasional pubic "hair bumps" that cause itching. Reports no STI history.   Past Medical History:  Diagnosis Date  . Thyroid disease    ?hyper    Patient Active Problem List   Diagnosis Date Noted  . Pregnancy of unknown anatomic location 08/25/2015    Past Surgical History:  Procedure Laterality Date  . DILATION AND CURETTAGE OF UTERUS N/A 08/25/2015   Procedure: DILATATION AND CURETTAGE;  Surgeon: [place] Bingharlie Pickens, MD;  Location: [facility] ORS;  Service: Gynecology;  Laterality: N/A;  doxycycline 100mg  IV x 1 to be given pre op  . LAPAROSCOPIC BILATERAL SALPINGECTOMY Bilateral 08/25/2015   Procedure: LAPAROSCOPIC BILATERAL SALPINGECTOMY;  Surgeon: [place] Bingharlie Pickens, MD;  Location: [facility] ORS;  Service: Gynecology;  Laterality: Bilateral;  12mm balloon trocar, 5mm 30degree scope  . LAPAROSCOPY WITH TUBAL LIGATION  2015   kleppinger per patient  . TONSILLECTOMY      Prior to Admission medications   Medication Sig Start Date End Date  Taking? Authorizing Provider  docusate sodium (COLACE) 100 MG capsule Take 1 capsule (100 mg total) by mouth 2 (two) times daily. 08/25/15   [place] Bingharlie Pickens, MD  metroNIDAZOLE (FLAGYL) 500 MG tablet Take 1 tablet (500 mg total) by mouth 3 (three) times daily. 07/27/16 08/03/16  Joni Reiningonald K Vicky Schleich, PA-C  oxyCODONE-acetaminophen (PERCOCET/ROXICET) 5-325 MG tablet Take 1 tablet by mouth every 6 (six) hours as needed for severe pain. 08/25/15   [place] Bingharlie Pickens, MD  oxyCODONE-acetaminophen (ROXICET) 5-325 MG tablet Take 1 tablet by mouth every 6 (six) hours as needed. 08/25/15   Rebecka ApleyAllison P Webster, MD  phenazopyridine (PYRIDIUM) 200 MG tablet Take 1 tablet (200 mg total) by mouth 3 (three) times daily as needed for pain. 07/22/16 07/22/17  Chinita Pesterari B Triplett, FNP    Allergies Morphine and related  No family history on file.  Social History Social History  Substance Use Topics  . Smoking status: Current Every Day Smoker  . Smokeless tobacco: Never Used  . Alcohol use Not on file    Review of Systems Constitutional: No fever/chills Eyes: No visual changes. ENT: No sore throat. Cardiovascular: Denies chest pain. Respiratory: Denies shortness of breath. Gastrointestinal: No abdominal pain.  No nausea, no vomiting.  No diarrhea.  No constipation. Genitourinary: Positive for vulvar itch, "fishy" smelling vaginal discharge. Negative for dysuria, increased urinary frequency. Musculoskeletal: Negative for back pain. Skin: Negative for rash. Allergic/Immunilogical: Allergic to morphine.   ____________________________________________   PHYSICAL EXAM:  VITAL SIGNS: ED Triage Vitals  Enc Vitals Group     BP 07/27/16 1211 118/65  Pulse Rate 07/27/16 1211 (!) 104     Resp 07/27/16 1211 18     Temp 07/27/16 1211 98.6 F (37 C)     Temp Source 07/27/16 1211 Oral     SpO2 07/27/16 1211 98 %     Weight 07/27/16 1211 126 lb (57.2 kg)     Height 07/27/16 1211 5' (1.524 m)     Head Circumference --      Peak  Flow --      Pain Score 07/27/16 1213 0     Pain Loc --      Pain Edu? --      Excl. in GC? --     Constitutional: Alert and oriented. Well appearing and in no acute distress. Eyes: Conjunctivae are normal. PERRL. EOMI. Head: Atraumatic. Nose: No congestion/rhinnorhea. Mouth/Throat: Mucous membranes are moist.  Oropharynx non-erythematous. Neck: No stridor.   Hematological/Lymphatic/Immunilogical: No cervical lymphadenopathy. Cardiovascular: Normal rate, regular rhythm. Grossly normal heart sounds.  Good peripheral circulation. Respiratory: Normal respiratory effort.  No retractions. Lungs CTAB. Gastrointestinal: Soft and nontender. No distention. No abdominal bruits. No CVA tenderness. Genitourinary: Speculum exam reveals white, thin cervical mucus, without malodorous smell. Musculoskeletal: No lower extremity tenderness nor edema.  No joint effusions. Neurologic:  Normal speech and language. No gross focal neurologic deficits are appreciated. No gait instability. Skin:  Skin is warm, dry and intact. No rash noted. Psychiatric: Mood and affect are normal. Speech and behavior are normal.  ____________________________________________   LABS (all labs ordered are listed, but only abnormal results are displayed)  Labs Reviewed  WET PREP, GENITAL - Abnormal; Notable for the following:       Result Value   Clue Cells Wet Prep HPF POC PRESENT (*)    WBC, Wet Prep HPF POC FEW (*)    All other components within normal limits  URINALYSIS, COMPLETE (UACMP) WITH MICROSCOPIC - Abnormal; Notable for the following:    Color, Urine YELLOW (*)    APPearance CLEAR (*)    pH 9.0 (*)    Protein, ur 30 (*)    Squamous Epithelial / LPF 0-5 (*)    All other components within normal limits  CHLAMYDIA/NGC RT PCR ([facility] ONLY)    ____________________________________________  EKG   ____________________________________________  RADIOLOGY   ____________________________________________   PROCEDURES  Procedure(s) performed: None  Procedures  Critical Care performed: No  ____________________________________________   INITIAL IMPRESSION / ASSESSMENT AND PLAN / ED COURSE  Pertinent labs & imaging results that were available during my care of the patient were reviewed by me and considered in my medical decision making (see chart for details).  Bacterial vaginitis. Patient given discharge care instruction. Patient prescription for Flagyl. Patient advised to follow-up with family doctor in 10 days for reevaluation.  Clinical Course    Discussed patient with her was positive for clue cells.  ____________________________________________   FINAL CLINICAL IMPRESSION(S) / ED DIAGNOSES  Final diagnoses:  Bacterial vaginosis      NEW MEDICATIONS STARTED DURING THIS VISIT:  New Prescriptions   METRONIDAZOLE (FLAGYL) 500 MG TABLET    Take 1 tablet (500 mg total) by mouth 3 (three) times daily.     Note:  This document was prepared using Dragon voice recognition software and may include unintentional dictation errors.    Joni Reining, PA-C 07/27/16 1428    Charlynne Pander, MD 07/27/16 [phone]

## 2016-07-27 NOTE — ED Notes (Signed)
Pt in via triage with complaints of white foul vaginal discharge and itching x 2-3 days.  Pt reports being seen her recently for the same with unremarkable exam.  Pt states OBGYN is unable to see her until Feb.  Pt denies any abnormal bleeding.  Pt ambulatory to room, no immediate distress noted at this time.

## 2016-10-18 ENCOUNTER — Encounter: Payer: Self-pay | Admitting: Emergency Medicine

## 2016-10-18 ENCOUNTER — Emergency Department
Admission: EM | Admit: 2016-10-18 | Discharge: 2016-10-18 | Disposition: A | Discharge status: Home / Self Care | Payer: Medicaid Other | Attending: Student in an Organized Health Care Education/Training Program | Admitting: Student in an Organized Health Care Education/Training Program

## 2016-10-18 ENCOUNTER — Emergency Department
Admission: EM | Admit: 2016-10-18 | Discharge: 2016-10-18 | Disposition: A | Discharge status: Home / Self Care | Payer: Medicaid Other | Source: Home / Self Care | Attending: Student in an Organized Health Care Education/Training Program | Admitting: Student in an Organized Health Care Education/Training Program

## 2016-10-18 DIAGNOSIS — R102 Pelvic and perineal pain: Secondary | ICD-10-CM | POA: Diagnosis present

## 2016-10-18 DIAGNOSIS — B9689 Other specified bacterial agents as the cause of diseases classified elsewhere: Secondary | ICD-10-CM

## 2016-10-18 DIAGNOSIS — N760 Acute vaginitis: Principal | ICD-10-CM

## 2016-10-18 LAB — URINALYSIS, ROUTINE W REFLEX MICROSCOPIC
BILIRUBIN URINE: NEGATIVE
GLUCOSE, UA: NEGATIVE mg/dL
Hgb urine dipstick: NEGATIVE
KETONES UR: 5 mg/dL — AB
NITRITE: NEGATIVE
PROTEIN: 30 mg/dL — AB
Specific Gravity, Urine: 1.026 (ref 1.005–1.030)
pH: 6 (ref 5.0–8.0)

## 2016-10-18 LAB — WET PREP, GENITAL
Sperm: NONE SEEN
Trich, Wet Prep: NONE SEEN
Yeast Wet Prep HPF POC: NONE SEEN

## 2016-10-18 LAB — PREGNANCY, URINE: PREG TEST UR: NEGATIVE

## 2016-10-18 MED ORDER — METRONIDAZOLE 250 MG PO TABS: 500.0000 mg | ORAL_TABLET | Freq: Two times a day (BID) | ORAL | 0 refills | Status: AC

## 2016-10-18 MED ORDER — PROMETHAZINE HCL 12.5 MG PO TABS: 12.5000 mg | ORAL_TABLET | Freq: Four times a day (QID) | ORAL | 0 refills | Status: AC | PRN

## 2016-10-18 NOTE — ED Notes (Signed)
E-signature box not working. Pt verbalized understanding of discharge instructions and denied questions. 

## 2016-10-18 NOTE — ED Triage Notes (Signed)
Pt arrives ambulatory to triage with c/o of pelvic pain. Pt went to the acute care x2 weeks ago and was diagnosed with bacterial vaginosis. Pt states that you took all of her prescription for Keflex but she is still having painful urination and discharge. Pt is in NAD at this time.

## 2016-10-18 NOTE — ED Provider Notes (Signed)
[facility] Emergency Department Provider Note    First MD Initiated Contact with Patient 10/18/16 [phone]     (approximate)  I have reviewed the triage vital signs and the nursing notes.   HISTORY  Chief Complaint Vaginal Pain    HPI Vickie Francis is a 26 y.o. female presents with 2 week of intermittent vaginal pain associated with lower abdominal cramping and clear "fishy odor "vaginal discharge. The patient is been treated for bacterial vaginosis twice over the past 3 months.  Had a of treatment of a single dose of Flagyl 2 weeks ago and states that that made the pain worse for 24 hours. She is still sexually active. They do not use your contraception. Denies any fevers.   Past Medical History:  Diagnosis Date  . Thyroid disease    ?hyper   No family history on file. Past Surgical History:  Procedure Laterality Date  . DILATION AND CURETTAGE OF UTERUS N/A 08/25/2015   Procedure: DILATATION AND CURETTAGE;  Surgeon: [place] Bing, MD;  Location: [facility] ORS;  Service: Gynecology;  Laterality: N/A;  doxycycline  IV x 1 to be given pre op  . LAPAROSCOPIC BILATERAL SALPINGECTOMY Bilateral 08/25/2015   Procedure: LAPAROSCOPIC BILATERAL SALPINGECTOMY;  Surgeon: [place] Bing, MD;  Location: [facility] ORS;  Service: Gynecology;  Laterality: Bilateral;  12mm balloon trocar, 5mm 30degree scope  . LAPAROSCOPY WITH TUBAL LIGATION  2015   kleppinger per patient  . TONSILLECTOMY     Patient Active Problem List   Diagnosis Date Noted  . Pregnancy of unknown anatomic location 08/25/2015      Prior to Admission medications   Medication Sig Start Date End Date Taking? Authorizing Provider  docusate sodium (COLACE) 100 MG capsule Take 1 capsule (100 mg total) by mouth 2 (two) times daily. 08/25/15   [place] Bing, MD  metroNIDAZOLE (FLAGYL) 250 MG tablet Take 2 tablets (500 mg total) by mouth 2 (two) times daily. 10/18/16 10/25/16  Willy Eddy, MD    oxyCODONE-acetaminophen (PERCOCET/ROXICET) 5-325 MG tablet Take 1 tablet by mouth every 6 (six) hours as needed for severe pain. 08/25/15   [place] Bing, MD  oxyCODONE-acetaminophen (ROXICET) 5-325 MG tablet Take 1 tablet by mouth every 6 (six) hours as needed. 08/25/15   Rebecka Apley, MD  phenazopyridine (PYRIDIUM) 200 MG tablet Take 1 tablet (200 mg total) by mouth 3 (three) times daily as needed for pain. 07/22/16 07/22/17  Chinita Pester, FNP  promethazine (PHENERGAN) 12.5 MG tablet Take 1 tablet (12.5 mg total) by mouth every 6 (six) hours as needed for nausea or vomiting. 10/18/16   Willy Eddy, MD    Allergies Morphine and related    Social History Social History  Substance Use Topics  . Smoking status: Never Smoker  . Smokeless tobacco: Never Used  . Alcohol use No    Review of Systems Patient denies headaches, rhinorrhea, blurry vision, numbness, shortness of breath, chest pain, edema, cough, abdominal pain, nausea, vomiting, diarrhea, dysuria, fevers, rashes or hallucinations unless otherwise stated above in HPI. ____________________________________________   PHYSICAL EXAM:  VITAL SIGNS: Vitals:   10/18/16 0407  BP: 110/70  Pulse: 85  Resp: 16  Temp: 98.1 F (36.7 C)    Constitutional: Alert and oriented. Well appearing and in no acute distress. Eyes: Conjunctivae are normal. PERRL. EOMI. Head: Atraumatic. Nose: No congestion/rhinnorhea. Mouth/Throat: Mucous membranes are moist.  Oropharynx non-erythematous. Neck: No stridor. Painless ROM. No cervical spine tenderness to palpation Hematological/Lymphatic/Immunilogical: No cervical lymphadenopathy. Cardiovascular: Normal  rate, regular rhythm.  Good peripheral circulation. Respiratory: Normal respiratory effort.  No retractions.Gastrointestinal: Soft and nontender. No distention. No abdominal bruits. No CVA tenderness. Musculoskeletal: No lower extremity tenderness nor edema.  No joint  effusions. Neurologic:  Normal speech and language. No gross focal neurologic deficits are appreciated. No gait instability. Skin:  Skin is warm, dry and intact. No rash noted. Psychiatric: Mood and affect are normal. Speech and behavior are normal.  ____________________________________________   LABS (all labs ordered are listed, but only abnormal results are displayed)  Results for orders placed or performed during the hospital encounter of 10/18/16 (from the past 24 hour(s))  Urinalysis, Routine w reflex microscopic     Status: Abnormal   Collection Time: 10/18/16  4:14 AM  Result Value Ref Range   Color, Urine YELLOW (A) YELLOW   APPearance HAZY (A) CLEAR   Specific Gravity, Urine 1.026 1.005 - 1.030   pH 6.0 5.0 - 8.0   Glucose, UA NEGATIVE NEGATIVE mg/dL   Hgb urine dipstick NEGATIVE NEGATIVE   Bilirubin Urine NEGATIVE NEGATIVE   Ketones, ur 5 (A) NEGATIVE mg/dL   Protein, ur 30 (A) NEGATIVE mg/dL   Nitrite NEGATIVE NEGATIVE   Leukocytes, UA SMALL (A) NEGATIVE   RBC / HPF 0-5 0 - 5 RBC/hpf   WBC, UA 0-5 0 - 5 WBC/hpf   Bacteria, UA RARE (A) NONE SEEN   Squamous Epithelial / LPF 0-5 (A) NONE SEEN   Mucous PRESENT   Pregnancy, urine     Status: None   Collection Time: 10/18/16  4:14 AM  Result Value Ref Range   Preg Test, Ur NEGATIVE NEGATIVE  Wet prep, genital     Status: Abnormal   Collection Time: 10/18/16  7:20 AM  Result Value Ref Range   Yeast Wet Prep HPF POC NONE SEEN NONE SEEN   Trich, Wet Prep NONE SEEN NONE SEEN   Clue Cells Wet Prep HPF POC PRESENT (A) NONE SEEN   WBC, Wet Prep HPF POC FEW (A) NONE SEEN   Sperm NONE SEEN    ____________________________________________  ____________________________________________  RADIOLOGY   ____________________________________________   PROCEDURES  Procedure(s) performed:  Procedures    Critical Care performed: no ____________________________________________   INITIAL IMPRESSION / ASSESSMENT AND PLAN /  ED COURSE  Pertinent labs & imaging results that were available during my care of the patient were reviewed by me and considered in my medical decision making (see chart for details).  DDX: bv, candidiasis,uti  Vickie Francis is a 26 y.o. who presents to the ED with Vaginal discharge and discomfort as described above. Wet prep with evidence of bacterial vaginosis. Abdominal exam is reassuring. Patient has follow-up with PCP. Do not feel further diagnostic testing clinically indicated.  Have discussed with the patient and available family all diagnostics and treatments performed thus far and all questions were answered to the best of my ability. The patient demonstrates understanding and agreement with plan.       ____________________________________________   FINAL CLINICAL IMPRESSION(S) / ED DIAGNOSES  Final diagnoses:  Bacterial vaginosis  Vaginal pain      NEW MEDICATIONS STARTED DURING THIS VISIT:  New Prescriptions   METRONIDAZOLE (FLAGYL) 250 MG TABLET    Take 2 tablets (500 mg total) by mouth 2 (two) times daily.   PROMETHAZINE (PHENERGAN) 12.5 MG TABLET    Take 1 tablet (12.5 mg total) by mouth every 6 (six) hours as needed for nausea or vomiting.     Note:  This  document was prepared using Conservation officer, historic buildings and may include unintentional dictation errors.    Willy Eddy, MD 10/18/16 [phone]

## 2016-10-18 NOTE — ED Notes (Signed)
Patient reports symptoms started approximately 2 weeks ago with vaginal discharge and odor.  Reports went to urgent care and took medications as prescribed and was better for a bit.  Reports now with discharge again and urinary frequency.  Reports had small amount of abdominal cramping after taking Flagyl, but none since.  Denies other complaints.

## 2017-11-08 IMAGING — US US OB TRANSVAGINAL
1 series · 14 of 28 positions shown · non-contrast
Comparison: None.

CLINICAL DATA: Vaginal bleeding.

EXAM:
OBSTETRIC <14 WK US AND TRANSVAGINAL OB US
TECHNIQUE: Both transabdominal and transvaginal ultrasound examinations were
performed for complete evaluation of the gestation as well as the
maternal uterus, adnexal regions, and pelvic cul-de-sac.
Transvaginal technique was performed to assess early pregnancy.

[Series 1: us ob transvaginal · 0.23mm/px · 14 of 69 slices shown]
[im 3/69]
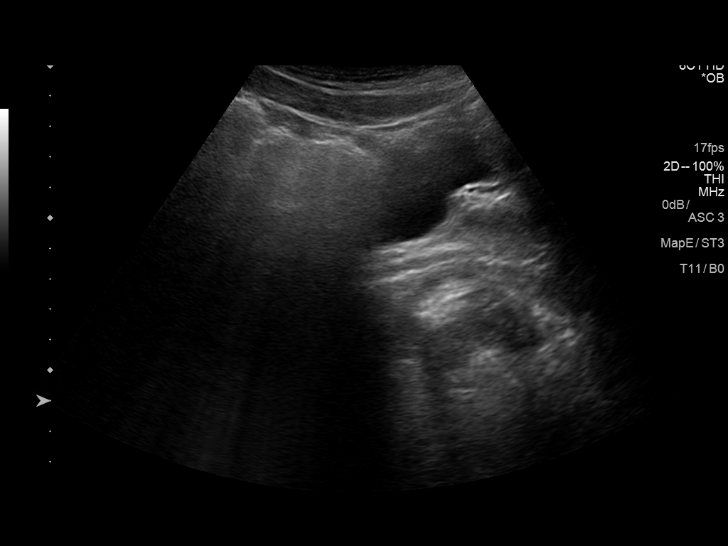
[im 8/69]
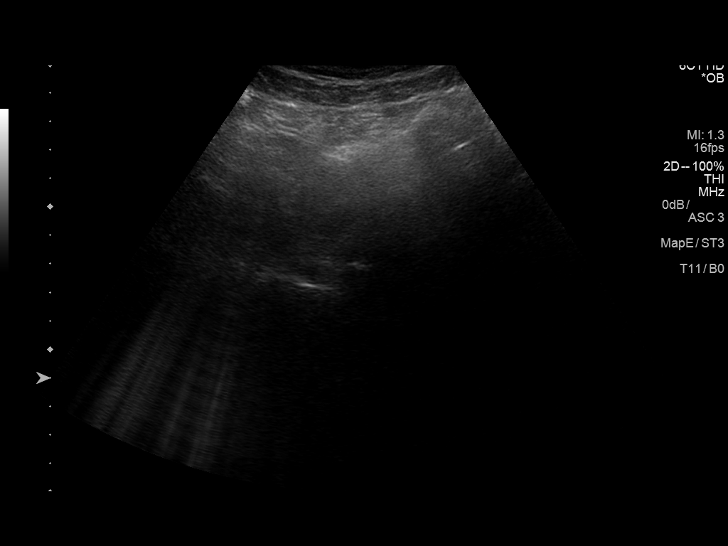
[im 13/69]
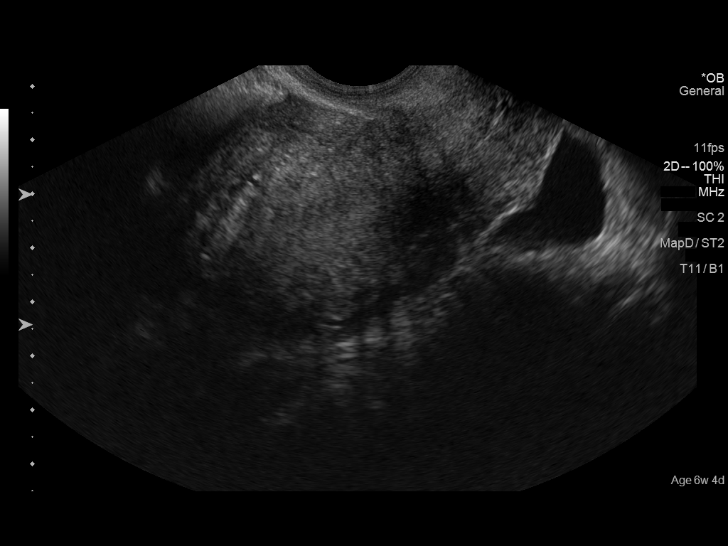
[im 18/69]
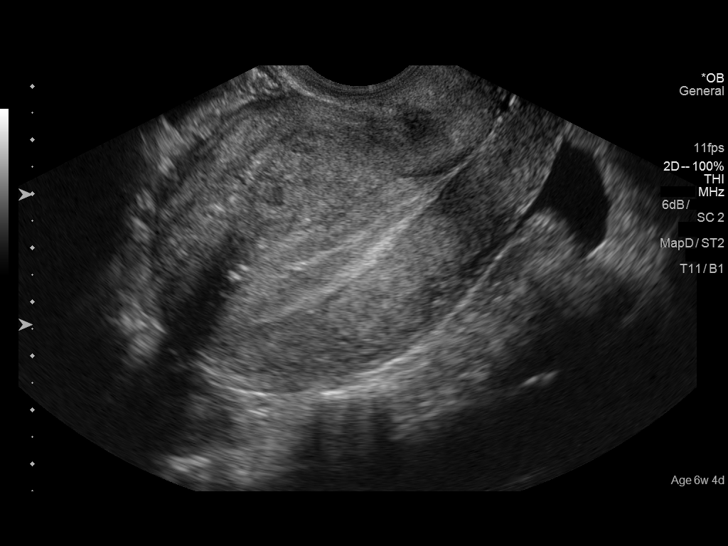
[im 23/69]
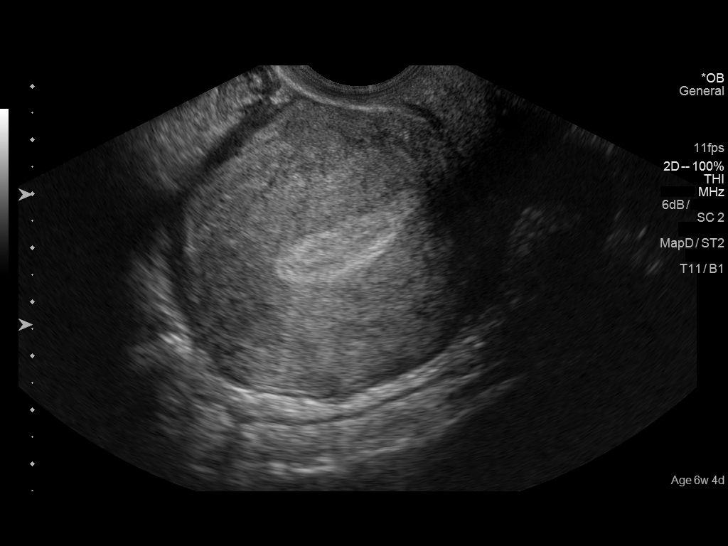
[im 28/69]
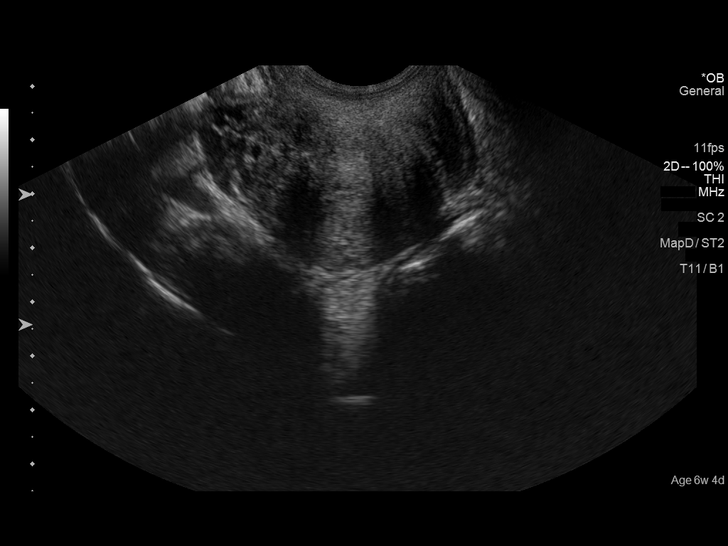
[im 33/69]
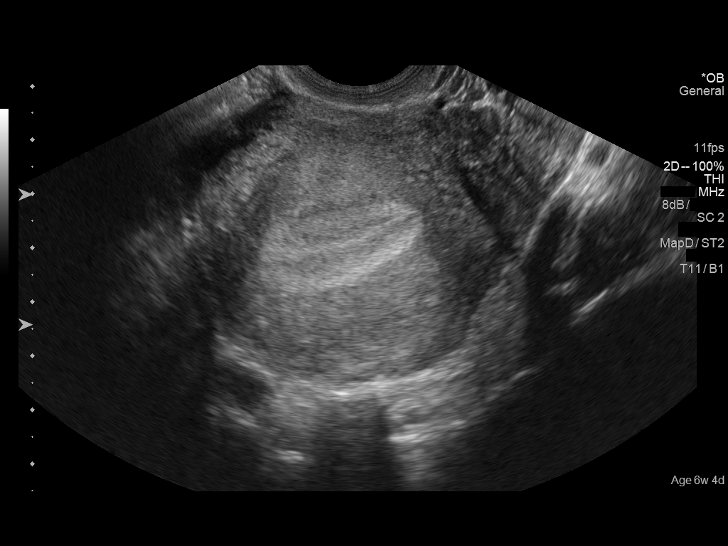
[im 38/69]
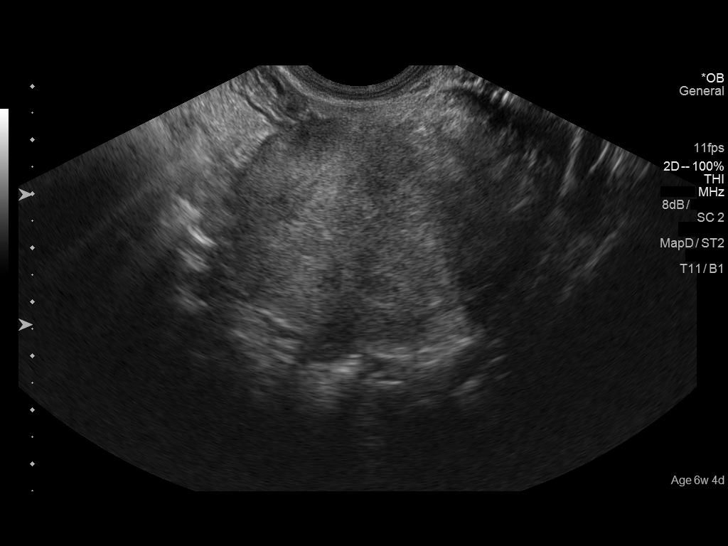
[im 43/69]
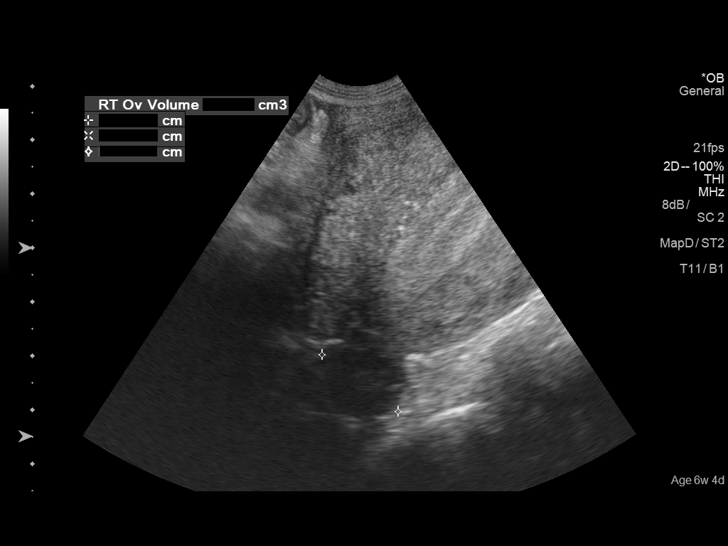
[im 48/69]
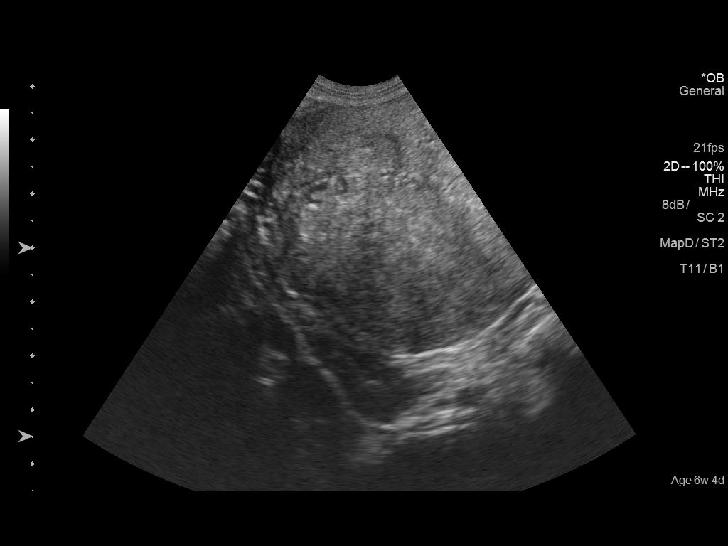
[im 53/69]
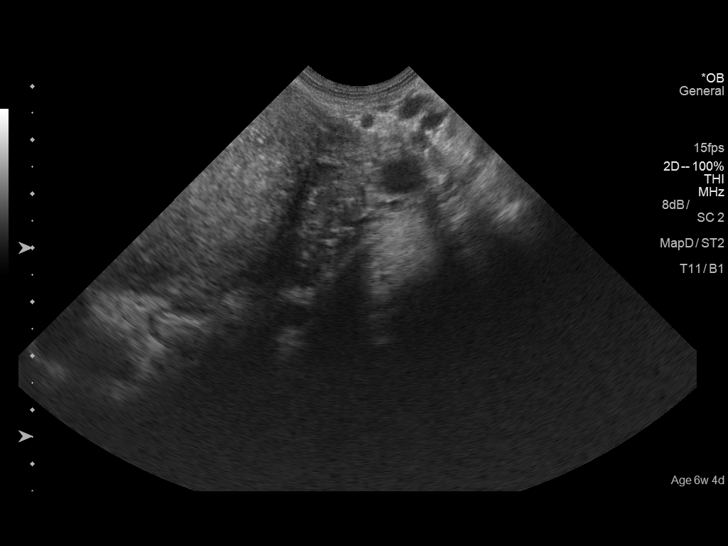
[im 58/69]
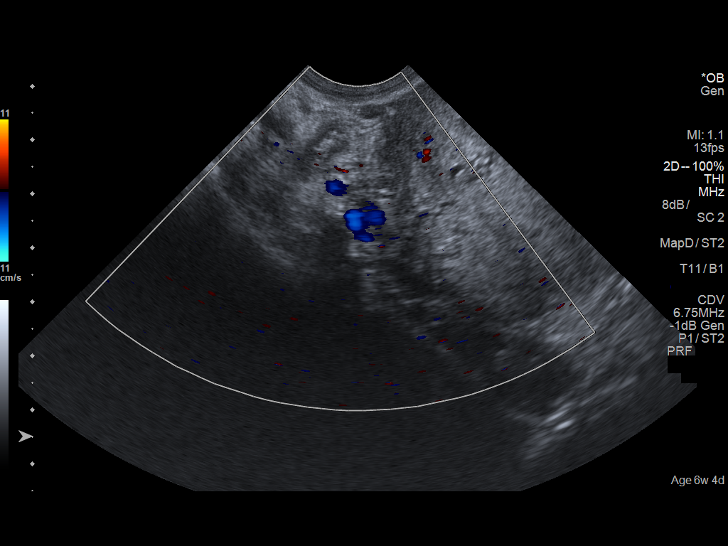
[im 63/69]
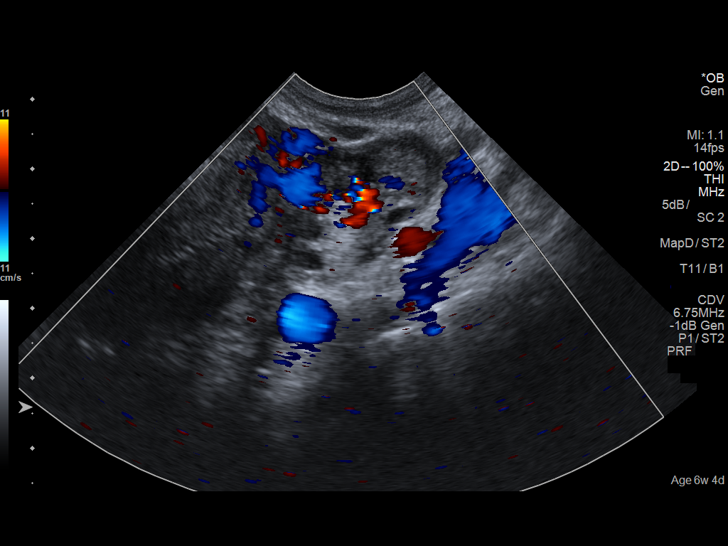
[im 69/69]
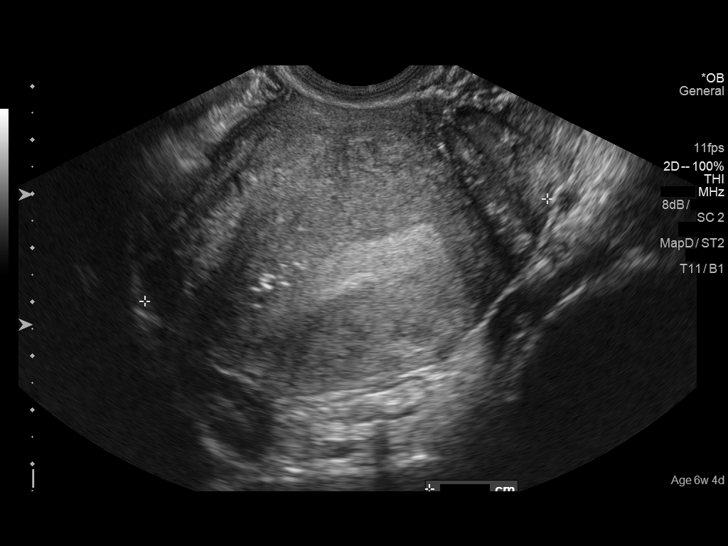

[14 of 28 positions shown; findings below may reference images not displayed]

FINDINGS: Intrauterine gestational sac: Not visualized

Yolk sac:  Not visualized

Embryo:  Not visualized

Subchorionic hemorrhage:  None visualized.

Maternal uterus/adnexae: No adnexal masses. Trace free fluid in the
pelvis. Endometrial stripe 13 mm in thickness.
IMPRESSION: No intrauterine pregnancy visualized. Differential considerations
would include early intrauterine pregnancy too early to visualize,
spontaneous abortion, or occult ectopic pregnancy. Recommend close
clinical followup and serial quantitative beta HCGs and ultrasounds.

## 2017-11-17 IMAGING — US US OB COMP LESS 14 WK
1 series · 13 of 28 positions shown · non-contrast
Comparison: Pelvic ultrasound performed 08/16/2015

CLINICAL DATA: Acute onset of pelvic pain.  Initial encounter.

EXAM:
OBSTETRIC <14 WK US AND TRANSVAGINAL OB US
TECHNIQUE: Both transabdominal and transvaginal ultrasound examinations were
performed for complete evaluation of the gestation as well as the
maternal uterus, adnexal regions, and pelvic cul-de-sac.
Transvaginal technique was performed to assess early pregnancy.

[Series 1: us ob comp less 14 wk · 0.23mm/px · 13 of 77 slices shown]
[im 3/77]
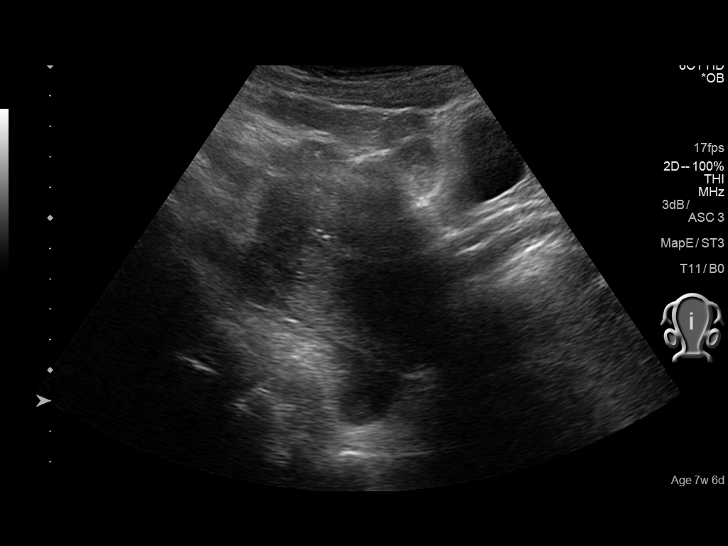
[im 9/77]
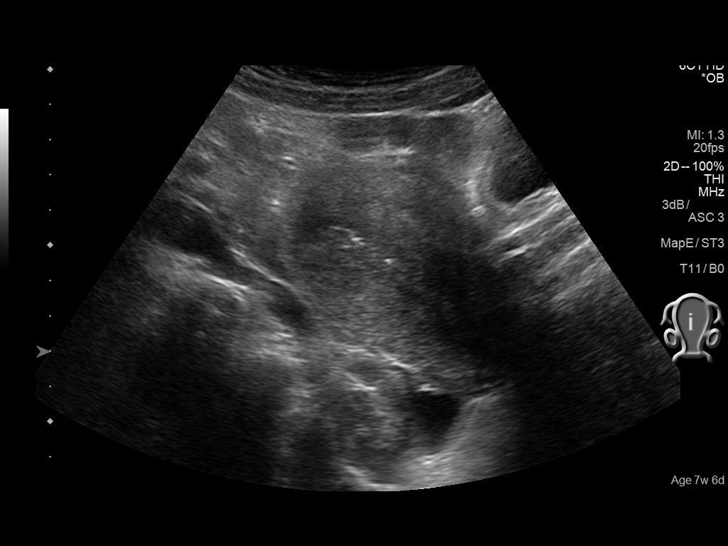
[im 15/77]
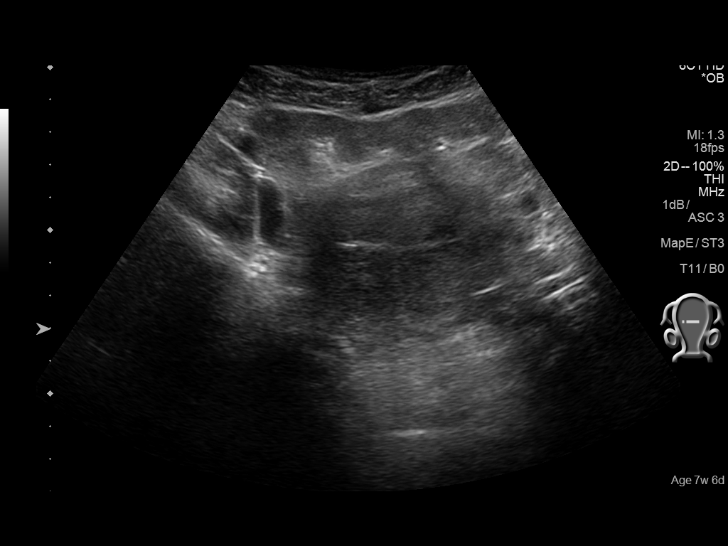
[im 20/77]
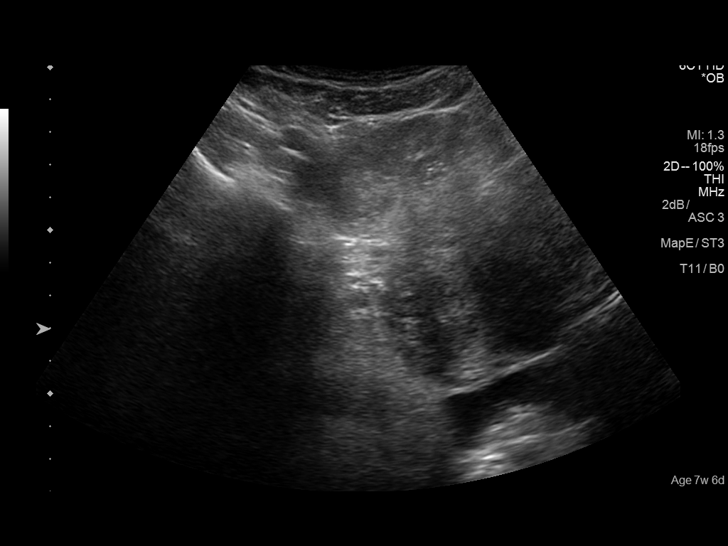
[im 26/77]
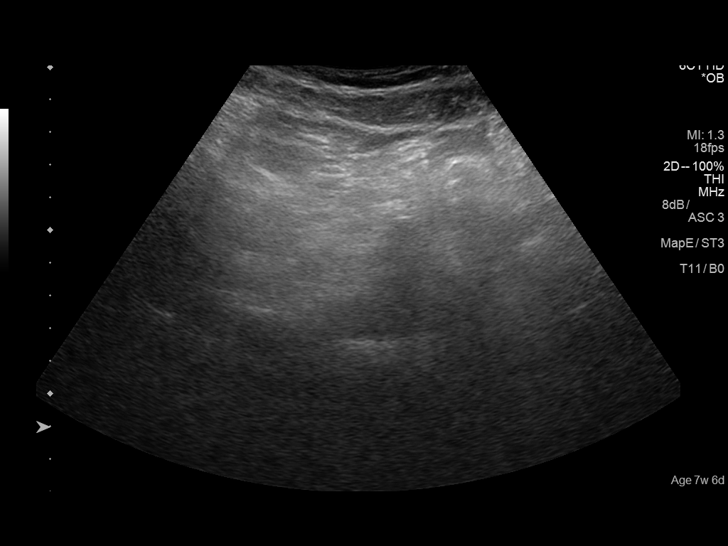
[im 31/77]
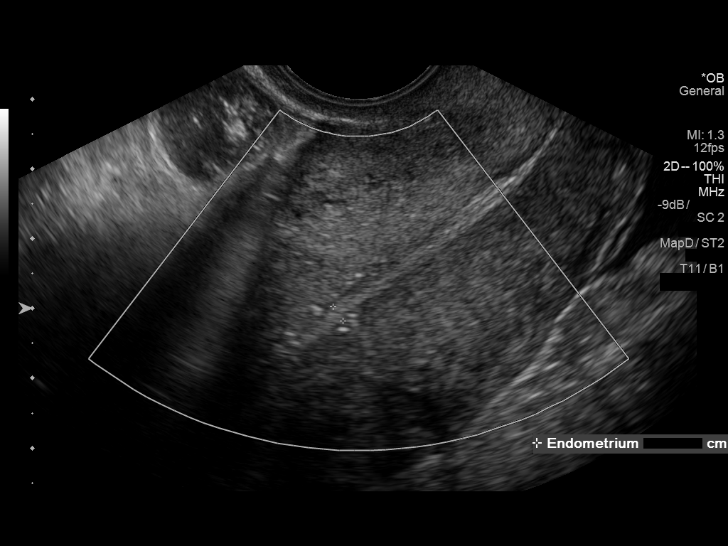
[im 40/77]
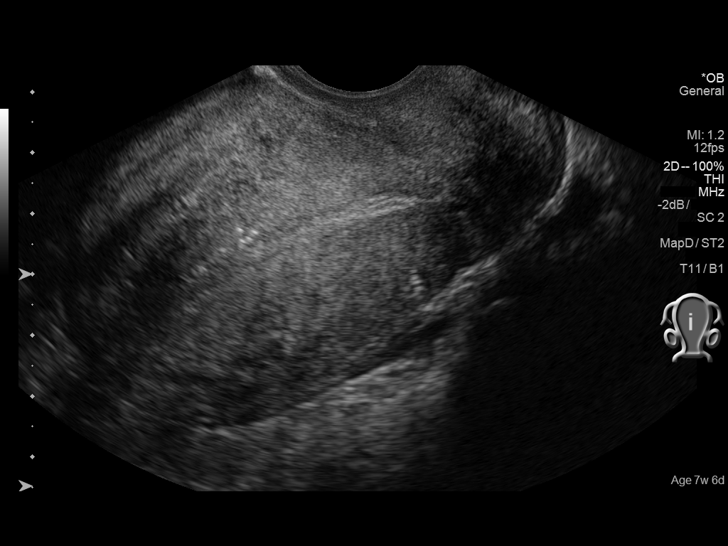
[im 46/77]
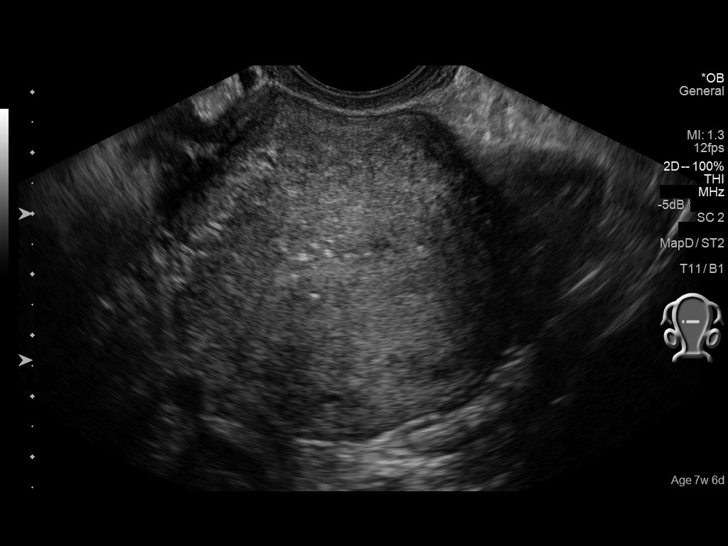
[im 51/77]
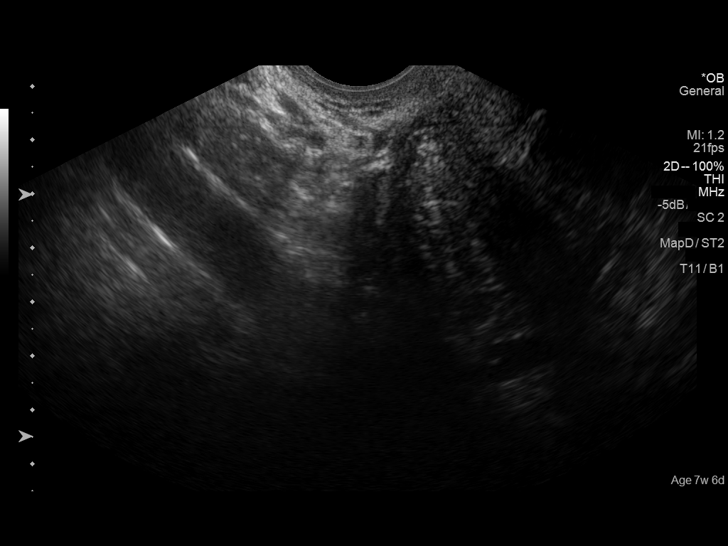
[im 57/77]
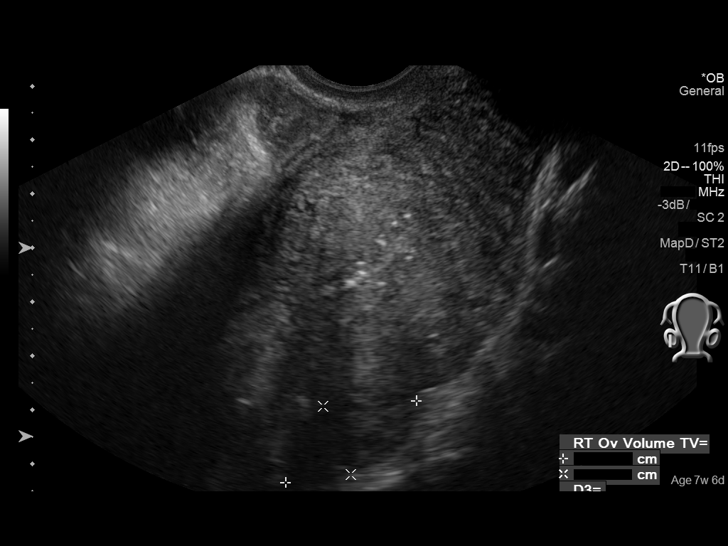
[im 62/77]
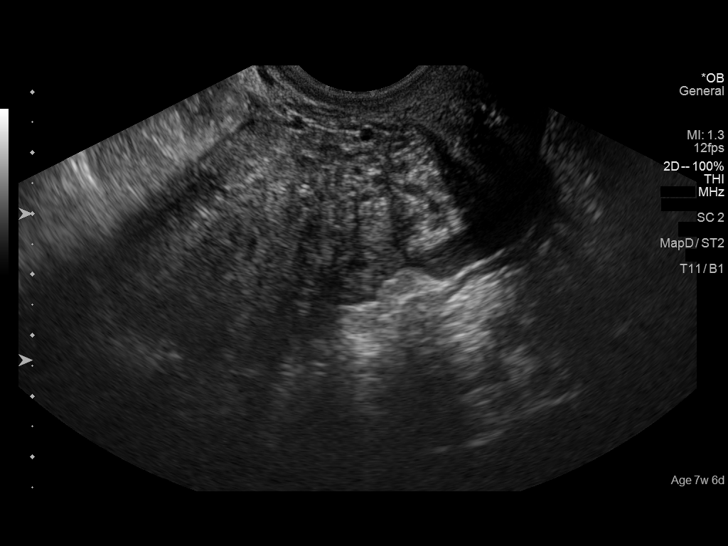
[im 68/77]
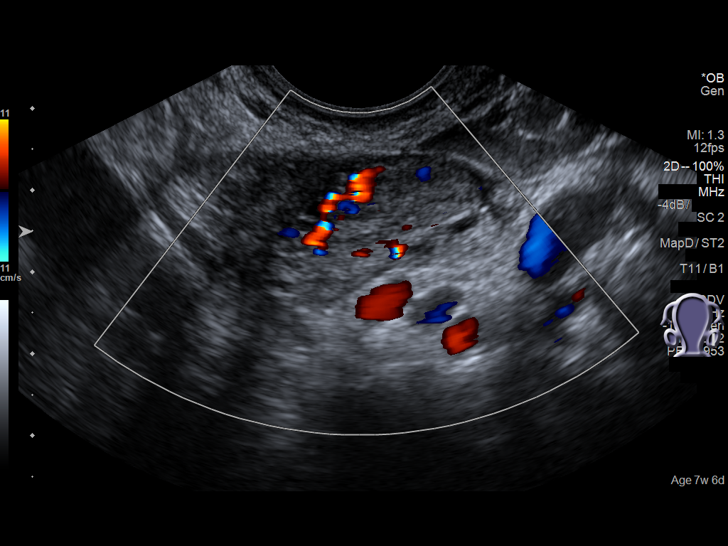
[im 74/77]
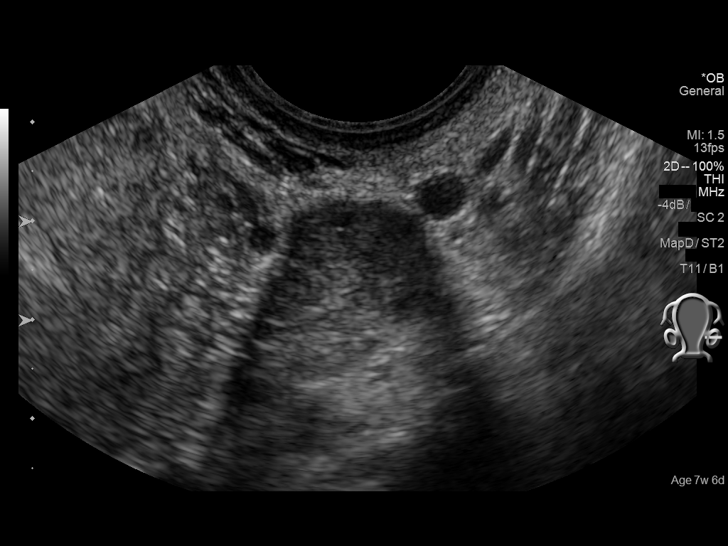

[13 of 28 positions shown; findings below may reference images not displayed]

FINDINGS: Intrauterine gestational sac: None seen.

Yolk sac:  N/A

Embryo:  N/A

Subchorionic hemorrhage:  None visualized.

Maternal uterus/adnexae: No subchorionic hemorrhage is noted. The
uterus is unremarkable in appearance.

The ovaries are unremarkable in appearance. The right ovary measures
2.9 x 1.4 x 1.2 cm, while the left ovary measures 3.0 x 1.8 x
cm. No suspicious adnexal masses are seen; there is no evidence for
ovarian torsion.

A small amount of free fluid is seen within the pelvic cul-de-sac.
IMPRESSION: No intrauterine gestational sac seen. No evidence for ectopic
pregnancy. This is unusual, given the quantitative beta HCG level of
[DATE]. Would follow-up quantitative beta HCG level. If it continues
to rise, follow-up pelvic ultrasound could be considered in 1-2
weeks, or sooner if the patient's symptoms worsen.

## 2018-02-07 ENCOUNTER — Emergency Department: Payer: Medicaid Other

## 2018-02-07 ENCOUNTER — Encounter: Payer: Self-pay | Admitting: Emergency Medicine

## 2018-02-07 ENCOUNTER — Emergency Department
Admission: EM | Admit: 2018-02-07 | Discharge: 2018-02-07 | Disposition: A | Discharge status: Home / Self Care | Payer: Medicaid Other | Attending: Emergency Medicine | Admitting: Emergency Medicine

## 2018-02-07 ENCOUNTER — Other Ambulatory Visit: Payer: Self-pay

## 2018-02-07 DIAGNOSIS — E079 Disorder of thyroid, unspecified: Secondary | ICD-10-CM | POA: Insufficient documentation

## 2018-02-07 DIAGNOSIS — N939 Abnormal uterine and vaginal bleeding, unspecified: Secondary | ICD-10-CM

## 2018-02-07 DIAGNOSIS — R1031 Right lower quadrant pain: Secondary | ICD-10-CM | POA: Insufficient documentation

## 2018-02-07 DIAGNOSIS — Z79899 Other long term (current) drug therapy: Secondary | ICD-10-CM | POA: Diagnosis not present

## 2018-02-07 LAB — URINALYSIS, COMPLETE (UACMP) WITH MICROSCOPIC
BILIRUBIN URINE: NEGATIVE
Bacteria, UA: NONE SEEN
GLUCOSE, UA: NEGATIVE mg/dL
KETONES UR: 20 mg/dL — AB
Nitrite: NEGATIVE
PH: 7 (ref 5.0–8.0)
Protein, ur: 30 mg/dL — AB
Specific Gravity, Urine: 1.021 (ref 1.005–1.030)

## 2018-02-07 LAB — CBC
HCT: 43.5 % (ref 35.0–47.0)
Hemoglobin: 14.9 g/dL (ref 12.0–16.0)
MCH: 32.3 pg (ref 26.0–34.0)
MCHC: 34.3 g/dL (ref 32.0–36.0)
MCV: 94.4 fL (ref 80.0–100.0)
PLATELETS: 223 10*3/uL (ref 150–440)
RBC: 4.61 MIL/uL (ref 3.80–5.20)
RDW: 13.2 % (ref 11.5–14.5)
WBC: 11.4 10*3/uL — AB (ref 3.6–11.0)

## 2018-02-07 LAB — COMPREHENSIVE METABOLIC PANEL
ALK PHOS: 51 U/L (ref 38–126)
ALT: 11 U/L (ref 0–44)
AST: 16 U/L (ref 15–41)
Albumin: 4.5 g/dL (ref 3.5–5.0)
Anion gap: 8 (ref 5–15)
BUN: 6 mg/dL (ref 6–20)
CALCIUM: 9.3 mg/dL (ref 8.9–10.3)
CO2: 28 mmol/L (ref 22–32)
CREATININE: 0.59 mg/dL (ref 0.44–1.00)
Chloride: 104 mmol/L (ref 98–111)
Glucose, Bld: 100 mg/dL — ABNORMAL HIGH (ref 70–99)
Potassium: 3.4 mmol/L — ABNORMAL LOW (ref 3.5–5.1)
Sodium: 140 mmol/L (ref 135–145)
TOTAL PROTEIN: 7.6 g/dL (ref 6.5–8.1)
Total Bilirubin: 0.6 mg/dL (ref 0.3–1.2)

## 2018-02-07 LAB — POCT PREGNANCY, URINE: Preg Test, Ur: NEGATIVE

## 2018-02-07 LAB — LIPASE, BLOOD: LIPASE: 30 U/L (ref 11–51)

## 2018-02-07 MED ORDER — ONDANSETRON HCL 4 MG/2ML IJ SOLN
4.0000 mg | Freq: Once | INTRAMUSCULAR | Status: AC
  Administered 2018-02-07: 4 mg via INTRAVENOUS
  Filled 2018-02-07: qty 2

## 2018-02-07 MED ORDER — IOHEXOL 300 MG/ML  SOLN
100.0000 mL | Freq: Once | INTRAMUSCULAR | Status: AC | PRN
  Administered 2018-02-07: 100 mL via INTRAVENOUS
  Filled 2018-02-07: qty 100

## 2018-02-07 MED ORDER — SODIUM CHLORIDE 0.9 % IV BOLUS
1000.0000 mL | Freq: Once | INTRAVENOUS | Status: AC
  Administered 2018-02-07: 1000 mL via INTRAVENOUS

## 2018-02-07 MED ORDER — ONDANSETRON 4 MG PO TBDP: 4.0000 mg | ORAL_TABLET | Freq: Three times a day (TID) | ORAL | 0 refills | Status: AC | PRN

## 2018-02-07 MED ORDER — METRONIDAZOLE 500 MG PO TABS: 500.0000 mg | ORAL_TABLET | Freq: Two times a day (BID) | ORAL | 0 refills | Status: AC

## 2018-02-07 MED ORDER — KETOROLAC TROMETHAMINE 30 MG/ML IJ SOLN
15.0000 mg | INTRAMUSCULAR | Status: AC
  Administered 2018-02-07: 15 mg via INTRAVENOUS
  Filled 2018-02-07: qty 1

## 2018-02-07 NOTE — ED Triage Notes (Signed)
Patient reports cramping in right pelvic region with pain radiating to back. Reports her period ended 5 days ago and two days ago she started spotting. Patient reports history of PID with similar symptoms. Reports removal of both tubes in 2017. Patient reports last intercourse 4 days ago.

## 2018-02-07 NOTE — Discharge Instructions (Signed)
Your CT scan was okay today. Your labs don't show any serious problems. Take flagyl as prescribed for likely bacterial vaginosis. If your bleeding or pain do not improve, follow up with your primary care doctor or gynecologist.

## 2018-02-07 NOTE — ED Provider Notes (Signed)
[facility] Emergency Department Provider Note  ____________________________________________  Time seen: Approximately 6:07 PM  I have reviewed the triage vital signs and the nursing notes.   HISTORY  Chief Complaint Abdominal Pain and Vaginal Bleeding    HPI Vickie Francis is a 27 y.o. female with a history of thyroid disease, PID, ectopic pregnancy, status post bilateral salpingectomy, who complains of gradual onset suprapubic and right lower quadrant pain in the past week.  Intermittent, waxing and waning, no aggravating or alleviating factors.  No dysuria frequency urgency.  Started when she had her menstrual cycle at her normal time 7 days ago , her period lasted 4 days as it usually does, but then after that she has had 3 days of vaginal spotting.  No abnormal discharge.  She is sexually active but uses condoms always.  Denies fevers chills sweats.  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     Past Medical History:  Diagnosis Date  . Thyroid disease    ?hyper     Patient Active Problem List   Diagnosis Date Noted  . Pregnancy of unknown anatomic location 08/25/2015     Past Surgical History:  Procedure Laterality Date  . DILATION AND CURETTAGE OF UTERUS N/A 08/25/2015   Procedure: DILATATION AND CURETTAGE;  Surgeon: [place] Bingharlie Pickens, MD;  Location: [facility] ORS;  Service: Gynecology;  Laterality: N/A;  doxycycline 100mg  IV x 1 to be given pre op  . LAPAROSCOPIC BILATERAL SALPINGECTOMY Bilateral 08/25/2015   Procedure: LAPAROSCOPIC BILATERAL SALPINGECTOMY;  Surgeon: [place] Bingharlie Pickens, MD;  Location: [facility] ORS;  Service: Gynecology;  Laterality: Bilateral;  12mm balloon trocar, 5mm 30degree scope  . LAPAROSCOPY WITH TUBAL LIGATION  2015   kleppinger per patient  . TONSILLECTOMY       Prior to Admission medications   Medication Sig Start Date End Date Taking? Authorizing Provider  docusate  sodium (COLACE) 100 MG capsule Take 1 capsule (100 mg total) by mouth 2 (two) times daily. 08/25/15   [place] BingPickens, Charlie, MD  metroNIDAZOLE (FLAGYL) 500 MG tablet Take 1 tablet (500 mg total) by mouth 2 (two) times daily. 02/07/18   Sharman CheekStafford, Mollie Rossano, MD  ondansetron (ZOFRAN ODT) 4 MG disintegrating tablet Take 1 tablet (4 mg total) by mouth every 8 (eight) hours as needed for nausea or vomiting. 02/07/18   Sharman CheekStafford, Alain Deschene, MD  oxyCODONE-acetaminophen (PERCOCET/ROXICET) 5-325 MG tablet Take 1 tablet by mouth every 6 (six) hours as needed for severe pain. 08/25/15    BingPickens, Charlie, MD  oxyCODONE-acetaminophen (ROXICET) 5-325 MG tablet Take 1 tablet by mouth every 6 (six) hours as needed. 08/25/15   Rebecka ApleyWebster, Allison P, MD  promethazine (PHENERGAN) 12.5 MG tablet Take 1 tablet (12.5 mg total) by mouth every 6 (six) hours as needed for nausea or vomiting. 10/18/16   Willy Eddyobinson, Patrick, MD     Allergies Morphine and related   No family history on file.  Social History Social History   Tobacco Use  . Smoking status: Never Smoker  . Smokeless tobacco: Never Used  Substance Use Topics  . Alcohol use: No  . Drug use: No    Review of Systems  Constitutional:   No fever or chills.  Cardiovascular:   No chest pain or syncope. Respiratory:   No dyspnea or cough. Gastrointestinal: Positive as above her suprapubic and right lower quadrant abdominal pain without vomiting and diarrhea.  Musculoskeletal:   Negative for focal pain or swelling All other  systems reviewed and are negative except as documented above in ROS and HPI.  ____________________________________________   PHYSICAL EXAM:  VITAL SIGNS: ED Triage Vitals  Enc Vitals Group     BP 02/07/18 1352 134/70     Pulse Rate 02/07/18 1352 99     Resp 02/07/18 1352 16     Temp 02/07/18 1352 98.7 F (37.1 C)     Temp Source 02/07/18 1352 Oral     SpO2 02/07/18 1352 98 %     Weight 02/07/18 1353 115 lb (52.2 kg)     Height 02/07/18 1353 5'  (1.524 m)     Head Circumference --      Peak Flow --      Pain Score 02/07/18 1353 7     Pain Loc --      Pain Edu? --      Excl. in GC? --     Vital signs reviewed, nursing assessments reviewed.   Constitutional:   Alert and oriented. Non-toxic appearance. Eyes:   Conjunctivae are normal. EOMI. PERRL. ENT      Head:   Normocephalic and atraumatic.      Nose:   No congestion/rhinnorhea.       Mouth/Throat:   MMM, no pharyngeal erythema. No peritonsillar mass.       Neck:   No meningismus. Full ROM. Hematological/Lymphatic/Immunilogical:   No cervical lymphadenopathy. Cardiovascular:   RRR. Symmetric bilateral radial and DP pulses.  No murmurs. Cap refill less than 2 seconds. Respiratory:   Normal respiratory effort without tachypnea/retractions. Breath sounds are clear and equal bilaterally. No wheezes/rales/rhonchi. Gastrointestinal:   Soft with right lower quadrant tenderness. Non distended. There is no CVA tenderness.  No rebound, rigidity, or guarding. Genitourinary:   Patient refuses Musculoskeletal:   Normal range of motion in all extremities. No joint effusions.  No lower extremity tenderness.  No edema. Neurologic:   Normal speech and language.  Motor grossly intact. No acute focal neurologic deficits are appreciated.  Skin:    Skin is warm, dry and intact. No rash noted.  No petechiae, purpura, or bullae.  ____________________________________________    LABS (pertinent positives/negatives) (all labs ordered are listed, but only abnormal results are displayed) Labs Reviewed  COMPREHENSIVE METABOLIC PANEL - Abnormal; Notable for the following components:      Result Value   Potassium 3.4 (*)    Glucose, Bld 100 (*)    All other components within normal limits  CBC - Abnormal; Notable for the following components:   WBC 11.4 (*)    All other components within normal limits  URINALYSIS, COMPLETE (UACMP) WITH MICROSCOPIC - Abnormal; Notable for the following  components:   Color, Urine YELLOW (*)    APPearance CLEAR (*)    Hgb urine dipstick SMALL (*)    Ketones, ur 20 (*)    Protein, ur 30 (*)    Leukocytes, UA TRACE (*)    All other components within normal limits  LIPASE, BLOOD  POC URINE PREG, ED  POCT PREGNANCY, URINE   ____________________________________________   EKG    ____________________________________________    RADIOLOGY  Ct Abdomen Pelvis W Contrast  Result Date: 02/07/2018 CLINICAL DATA:  27 year old female with a history right pelvic pain cramping EXAM: CT ABDOMEN AND PELVIS WITH CONTRAST TECHNIQUE: Multidetector CT imaging of the abdomen and pelvis was performed using the standard protocol following bolus administration of intravenous contrast. CONTRAST:  OMNIPAQUE IOHEXOL 300 MG/ML  SOLN COMPARISON:  None. FINDINGS: Lower chest: No  acute abnormality. Hepatobiliary: Unremarkable appearance of liver. Unremarkable gallbladder. Pancreas: Unremarkable pancreas Spleen: Unremarkable spleen Adrenals/Urinary Tract: Unremarkable appearance of the adrenal glands. No evidence of hydronephrosis of the right or left kidney. No nephrolithiasis. Unremarkable course of the bilateral ureters. Unremarkable appearance of the urinary bladder. Stomach/Bowel: Unremarkable appearance of stomach. Unremarkable small bowel with no abnormal distention. No transition point. No inflammatory changes. No transition point. Normal appendix. Mild stool burden with no transition point. No inflammatory changes adjacent to the colon. Vascular/Lymphatic: No significant atherosclerotic disease. Mesenteric arteries, renal arteries, and the visualized lower extremity arteries are patent. No adenopathy Reproductive: Fluid within the endometrial canal. Likely physiologic changes associated with the left adnexa. Right ovary demonstrates 16 mm low-density cystic structure. Other: No hernia. Musculoskeletal: No acute displaced fracture. IMPRESSION: No acute CT  finding to account for abdominal/pelvic pain. Likely physiologic changes of the bilateral adnexa, including a 16 mm right ovarian cyst/follicle. Physiologic changes of the endometrium. Electronically Signed   By: Gilmer Mor D.O.   On: 02/07/2018 16:37    ____________________________________________   PROCEDURES Procedures  ____________________________________________  DIFFERENTIAL DIAGNOSIS   Ovarian cyst, appendicitis, bacterial vaginosis.  Doubt torsion, colitis, bowel perforation, bowel obstruction, hernia.  CLINICAL IMPRESSION / ASSESSMENT AND PLAN / ED COURSE  Pertinent labs & imaging results that were available during my care of the patient were reviewed by me and considered in my medical decision making (see chart for details).    Patient is nontoxic with normal vital signs, presents with lower/right lower quadrant abdominal pain and tenderness..  The patient this is consistent with bacterial vaginosis that she has had in the past but due to her focal tenderness in the right lower quadrant, I recommended CT scan of the abdomen pelvis to evaluate for appendicitis to which the patient agrees.  CT was performed and is unremarkable.  No evidence of appendicitis or other inflammatory condition.  No large ovarian cyst or ovarian swelling.  No need to pursue torsion work-up.  Again offered pelvic exam but the patient refuses.  She is agreeable to empiric treatment of BV with Flagyl.  Counseled to follow-up with primary care/gynecology in a week for reassessment of symptoms including persistent abnormal bleeding.      ____________________________________________   FINAL CLINICAL IMPRESSION(S) / ED DIAGNOSES    Final diagnoses:  Vaginal bleeding  RLQ abdominal pain     ED Discharge Orders        Ordered    ondansetron (ZOFRAN ODT) 4 MG disintegrating tablet  Every 8 hours PRN     02/07/18 1807    metroNIDAZOLE (FLAGYL) 500 MG tablet  2 times daily     02/07/18 1807       Portions of this note were generated with dragon dictation software. Dictation errors may occur despite best attempts at proofreading.    Sharman Cheek, MD 02/07/18 [phone]

## 2018-02-07 NOTE — ED Notes (Signed)
Patient transported to CT 

## 2018-02-07 NOTE — ED Notes (Signed)
Pt c/o suprapubic/pelvic pain for the past week, states she had her normal period, stopped and then started again with lower abd pain.

## 2018-02-24 ENCOUNTER — Emergency Department
Admission: EM | Admit: 2018-02-24 | Discharge: 2018-02-24 | Disposition: A | Discharge status: Home / Self Care | Payer: Medicaid Other | Attending: Emergency Medicine | Admitting: Emergency Medicine

## 2018-02-24 ENCOUNTER — Emergency Department: Payer: Medicaid Other

## 2018-02-24 ENCOUNTER — Other Ambulatory Visit: Payer: Self-pay

## 2018-02-24 DIAGNOSIS — N939 Abnormal uterine and vaginal bleeding, unspecified: Secondary | ICD-10-CM | POA: Insufficient documentation

## 2018-02-24 DIAGNOSIS — N760 Acute vaginitis: Secondary | ICD-10-CM | POA: Insufficient documentation

## 2018-02-24 DIAGNOSIS — R1030 Lower abdominal pain, unspecified: Secondary | ICD-10-CM | POA: Diagnosis not present

## 2018-02-24 DIAGNOSIS — E079 Disorder of thyroid, unspecified: Secondary | ICD-10-CM | POA: Diagnosis not present

## 2018-02-24 DIAGNOSIS — R58 Hemorrhage, not elsewhere classified: Secondary | ICD-10-CM

## 2018-02-24 DIAGNOSIS — B9689 Other specified bacterial agents as the cause of diseases classified elsewhere: Secondary | ICD-10-CM | POA: Diagnosis not present

## 2018-02-24 LAB — COMPREHENSIVE METABOLIC PANEL
ALBUMIN: 4.4 g/dL (ref 3.5–5.0)
ALK PHOS: 49 U/L (ref 38–126)
ALT: 15 U/L (ref 0–44)
AST: 18 U/L (ref 15–41)
Anion gap: 6 (ref 5–15)
BUN: 6 mg/dL (ref 6–20)
CO2: 27 mmol/L (ref 22–32)
CREATININE: 0.6 mg/dL (ref 0.44–1.00)
Calcium: 9.3 mg/dL (ref 8.9–10.3)
Chloride: 106 mmol/L (ref 98–111)
GFR calc Af Amer: >60 mL/min (ref >60)
GFR calc non Af Amer: >60 mL/min (ref >60)
GLUCOSE: 91 mg/dL (ref 70–99)
POTASSIUM: 3.8 mmol/L (ref 3.5–5.1)
Sodium: 139 mmol/L (ref 135–145)
Total Bilirubin: 0.9 mg/dL (ref 0.3–1.2)
Total Protein: 7 g/dL (ref 6.5–8.1)

## 2018-02-24 LAB — CBC
HEMATOCRIT: 42.6 % (ref 35.0–47.0)
HEMOGLOBIN: 14.9 g/dL (ref 12.0–16.0)
MCH: 32.6 pg (ref 26.0–34.0)
MCHC: 34.9 g/dL (ref 32.0–36.0)
MCV: 93.5 fL (ref 80.0–100.0)
Platelets: 210 10*3/uL (ref 150–440)
RBC: 4.56 MIL/uL (ref 3.80–5.20)
RDW: 13.9 % (ref 11.5–14.5)
WBC: 8.6 10*3/uL (ref 3.6–11.0)

## 2018-02-24 LAB — WET PREP, GENITAL
Sperm: NONE SEEN
Trich, Wet Prep: NONE SEEN
Yeast Wet Prep HPF POC: NONE SEEN

## 2018-02-24 LAB — CHLAMYDIA/NGC RT PCR (ARMC ONLY)
CHLAMYDIA TR: NOT DETECTED
N GONORRHOEAE: NOT DETECTED

## 2018-02-24 LAB — POCT PREGNANCY, URINE: PREG TEST UR: NEGATIVE

## 2018-02-24 NOTE — ED Triage Notes (Addendum)
Pt states she was seen here 7/23 for spotting and states she has been spotting ever since with heavier bleeding in the past 2 days with abd cramping,.

## 2018-02-24 NOTE — ED Provider Notes (Signed)
[facility] Emergency Department Provider Note  ____________________________________________  Time seen: Approximately 2:36 PM  I have reviewed the triage vital signs and the nursing notes.   HISTORY  Chief Complaint Vaginal Bleeding    HPI Vickie Francis is a 27 y.o. female that presents to the emergency department for evaluation of vaginal spotting and bleeding and low abdominal cramping on and off for the last 17 days.  Patient was evaluated in the emergency department on July 23.  She was given a prescription for Flagyl but never filled prescription.  Spotting continued on and off since then and has now had heavy vaginal bleeding for the last 2 days.  No clots.  She is changing her tampon every couple of hours.  No discharge.  She is having pain in her low abdomen on both sides but is worse on the right.  She does not yet have an OB.  No fever or chills.    Past Medical History:  Diagnosis Date  . Thyroid disease    ?hyper    Patient Active Problem List   Diagnosis Date Noted  . Pregnancy of unknown anatomic location 08/25/2015    Past Surgical History:  Procedure Laterality Date  . DILATION AND CURETTAGE OF UTERUS N/A 08/25/2015   Procedure: DILATATION AND CURETTAGE;  Surgeon: [place] Bingharlie Pickens, MD;  Location: [facility] ORS;  Service: Gynecology;  Laterality: N/A;  doxycycline 100mg  IV x 1 to be given pre op  . LAPAROSCOPIC BILATERAL SALPINGECTOMY Bilateral 08/25/2015   Procedure: LAPAROSCOPIC BILATERAL SALPINGECTOMY;  Surgeon: [place] Bingharlie Pickens, MD;  Location: [facility] ORS;  Service: Gynecology;  Laterality: Bilateral;  12mm balloon trocar, 5mm 30degree scope  . LAPAROSCOPY WITH TUBAL LIGATION  2015   kleppinger per patient  . TONSILLECTOMY      Prior to Admission medications   Medication Sig Start Date End Date Taking? Authorizing Provider  docusate sodium (COLACE) 100 MG capsule Take 1 capsule (100 mg total) by mouth 2 (two) times daily. 08/25/15   [place] BingPickens,  Charlie, MD  metroNIDAZOLE (FLAGYL) 500 MG tablet Take 1 tablet (500 mg total) by mouth 2 (two) times daily. 02/07/18   Sharman CheekStafford, Phillip, MD  ondansetron (ZOFRAN ODT) 4 MG disintegrating tablet Take 1 tablet (4 mg total) by mouth every 8 (eight) hours as needed for nausea or vomiting. 02/07/18   Sharman CheekStafford, Phillip, MD  oxyCODONE-acetaminophen (PERCOCET/ROXICET) 5-325 MG tablet Take 1 tablet by mouth every 6 (six) hours as needed for severe pain. 08/25/15   [place] BingPickens, Charlie, MD  oxyCODONE-acetaminophen (ROXICET) 5-325 MG tablet Take 1 tablet by mouth every 6 (six) hours as needed. 08/25/15   Rebecka ApleyWebster, Allison P, MD  promethazine (PHENERGAN) 12.5 MG tablet Take 1 tablet (12.5 mg total) by mouth every 6 (six) hours as needed for nausea or vomiting. 10/18/16   Willy Eddyobinson, Patrick, MD    Allergies Morphine and related  No family history on file.  Social History Social History   Tobacco Use  . Smoking status: Never Smoker  . Smokeless tobacco: Never Used  Substance Use Topics  . Alcohol use: No  . Drug use: No     Review of Systems  Constitutional: No fever/chills Gastrointestinal: Positive for abdominal pain.   No nausea, no vomiting.  Genitourinary: Negative for dysuria. Musculoskeletal: Negative for musculoskeletal pain. Skin: Negative for rash, abrasions, lacerations, ecchymosis.   ____________________________________________   PHYSICAL EXAM:  VITAL SIGNS: ED Triage Vitals  Enc Vitals Group     BP 02/24/18 1236 116/75     Pulse  Rate 02/24/18 1236 82     Resp 02/24/18 1236 18     Temp 02/24/18 1236 98 F (36.7 C)     Temp Source 02/24/18 1236 Oral     SpO2 02/24/18 1236 100 %     Weight 02/24/18 1236 118 lb (53.5 kg)     Height 02/24/18 1236 5' (1.524 m)     Head Circumference --      Peak Flow --      Pain Score 02/24/18 1240 8     Pain Loc --      Pain Edu? --      Excl. in GC? --      Constitutional: Alert and oriented. Well appearing and in no acute distress. Eyes:  Conjunctivae are normal. PERRL. EOMI. Head: Atraumatic. ENT:      Ears:      Nose: No congestion/rhinnorhea.      Mouth/Throat: Mucous membranes are moist.  Neck: No stridor.   Cardiovascular: Normal rate, regular rhythm.  Good peripheral circulation. Respiratory: Normal respiratory effort without tachypnea or retractions. Lungs CTAB. Good air entry to the bases with no decreased or absent breath sounds. Gastrointestinal: Bowel sounds 4 quadrants.  Soft with minimal right and left lower quadrant tenderness, worse on the right. No guarding or rigidity. No palpable masses. No distention. No CVA tenderness. Genitourinary: No external rashes or lesions.  Blood in vaginal canal.  No cervical motion tenderness. Musculoskeletal: Full range of motion to all extremities. No gross deformities appreciated. Neurologic:  Normal speech and language. No gross focal neurologic deficits are appreciated.  Skin:  Skin is warm, dry and intact. No rash noted. Psychiatric: Mood and affect are normal. Speech and behavior are normal. Patient exhibits appropriate insight and judgement.   ____________________________________________   LABS (all labs ordered are listed, but only abnormal results are displayed)  Labs Reviewed  WET PREP, GENITAL - Abnormal; Notable for the following components:      Result Value   Clue Cells Wet Prep HPF POC PRESENT (*)    WBC, Wet Prep HPF POC FEW (*)    All other components within normal limits  CHLAMYDIA/NGC RT PCR ([facility] ONLY)  CBC  COMPREHENSIVE METABOLIC PANEL  POC URINE PREG, ED  POCT PREGNANCY, URINE   ____________________________________________  EKG   ____________________________________________  RADIOLOGY   US Pelvic Complete With Transvaginal  Result Date: 02/24/2018 CLINICAL DATA:  Bleeding dysfunction EXAM: TRANSABDOMINAL AND TRANSVAGINAL ULTRASOUND OF PELVIS TECHNIQUE: Both transabdominal and transvaginal ultrasound examinations of the pelvis  were performed. Transabdominal technique was performed for global imaging of the pelvis including uterus, ovaries, adnexal regions, and pelvic cul-de-sac. It was necessary to proceed with endovaginal exam following the transabdominal exam to visualize the ovaries and endometrium. COMPARISON:  August 25, 2015 FINDINGS: Uterus Measurements: 9 x 5.2 x 6.4 cm. No fibroids or other mass visualized. Endometrium Thickness: 4.5 mm. There are nonspecific hyperechoic foci unchanged compared to prior ultrasound. No focal abnormality visualized. Right ovary Measurements: 2.7 x 1 x 1.4 cm. Normal appearance/no adnexal mass. Positive color flow is identified in the right ovary. Left ovary Measurements: 3.2 x 1.5 x 1.9 cm. Normal appearance/no adnexal mass. Positive color flow is identified in left ovary. Other findings Trace free fluid is identified in the pelvis. IMPRESSION: Endometrium measures 4.5 mm in thickness. There are nonspecific hyperechoic foci in the endometrium which are unchanged compared to prior ultrasound of 2017. Trace free fluid likely physiologic. Electronically Signed   By: Gabriel Carina.D.  On: 02/24/2018 15:35    ____________________________________________    PROCEDURES  Procedure(s) performed:    Procedures    Medications - No data to display   ____________________________________________   INITIAL IMPRESSION / ASSESSMENT AND PLAN / ED COURSE  Pertinent labs & imaging results that were available during my care of the patient were reviewed by me and considered in my medical decision making (see chart for details).  Review of the [place] CSRS was performed in accordance of the [facility] prior to dispensing any controlled drugs.   Patient presented to emergency department for evaluation of vaginal bleeding and abdominal discomfort for 2 weeks. Vital signs and exam are reassuring. Patient was evaluated in the emergency department 2 weeks ago for symptoms. CT 2 weeks ago consistent with  ovarian cyst/follicle. Ultrasound consistent with ultrasound in 2017. Web prep consistent with bacterial vaginosis. Gonorrhea and chlamydia are pending. She denies risk factors for STD and does not wish to wait for results.  Lab work within normal limits. Patient is hemodynamically stable  Pregnancy test negative. Patient never filled her prescription for Flagyl and will fill it now. Patient is to follow up with OB as directed. She is agreeable to call on Monday for an appointment. Patient is given ED precautions to return to the ED for any worsening or new symptoms.     ____________________________________________  FINAL CLINICAL IMPRESSION(S) / ED DIAGNOSES  Final diagnoses:  Bleeding  Vaginal bleeding  Bacterial vaginosis      NEW MEDICATIONS STARTED DURING THIS VISIT:  ED Discharge Orders    None          This chart was dictated using voice recognition software/Dragon. Despite best efforts to proofread, errors can occur which can change the meaning. Any change was purely unintentional.    Enid Derry, PA-C 02/24/18 1552    Don Perking, [place], MD 02/25/18 [phone]

## 2018-02-24 NOTE — Discharge Instructions (Signed)
Please fill your prescription for flagyl. Please call Ob/gyn on Monday for an appointment next week. Return to the emergency department for any new or changing symptoms.

## 2018-03-01 ENCOUNTER — Ambulatory Visit: Payer: Self-pay | Admitting: Obstetrics & Gynecology

## 2020-06-26 IMAGING — CT CT ABD-PELV W/ CM
2 of 4 series · 16 of 46 positions shown, 18 images · IV contrast (APPLIED)
Comparison: None.

CLINICAL DATA: 27-year-old female with a history right pelvic pain
cramping

EXAM:
CT ABDOMEN AND PELVIS WITH CONTRAST
TECHNIQUE: Multidetector CT imaging of the abdomen and pelvis was performed
using the standard protocol following bolus administration of
intravenous contrast.
CONTRAST:  100mL OMNIPAQUE IOHEXOL 300 MG/ML  SOLN

[Series 2: axial st · axial · 0.61mm/px · z∈[-186,+159]mm · 13 of 75 slices shown, 15 images]
[im 3/75  soft-tissue]
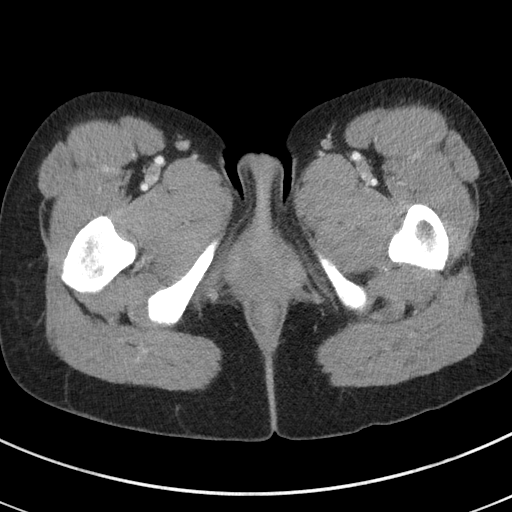
[im 3/75  bone]
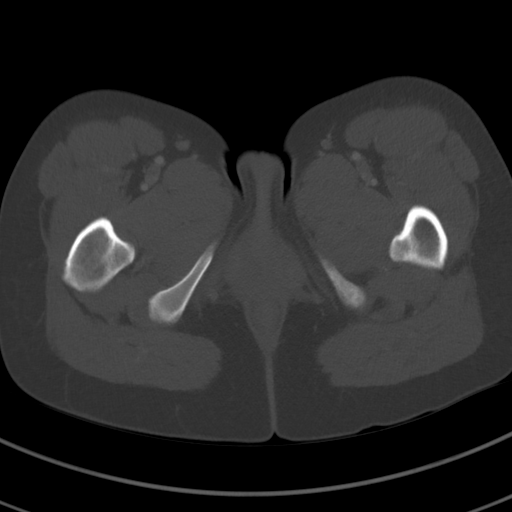
[im 9/75  soft-tissue]
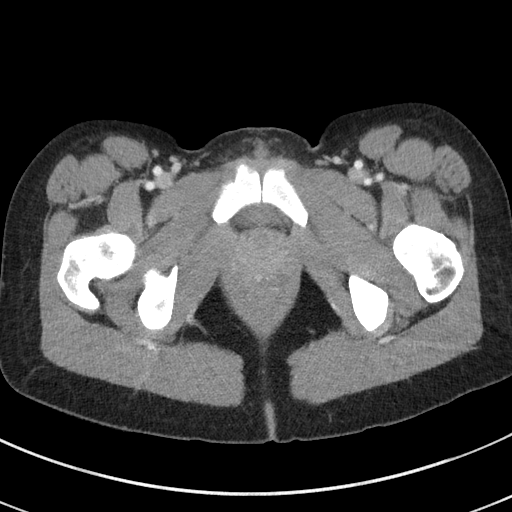
[im 15/75  soft-tissue]
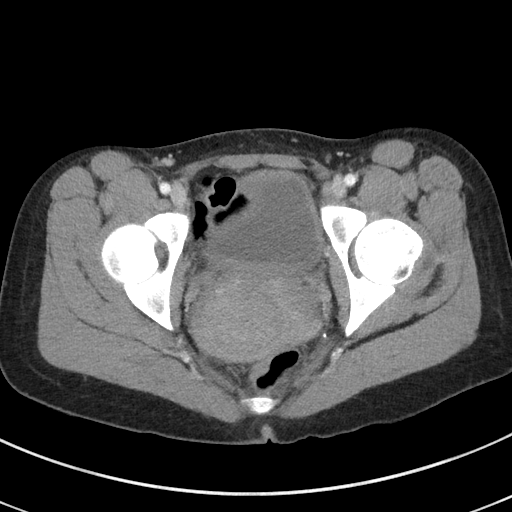
[im 20/75  soft-tissue]
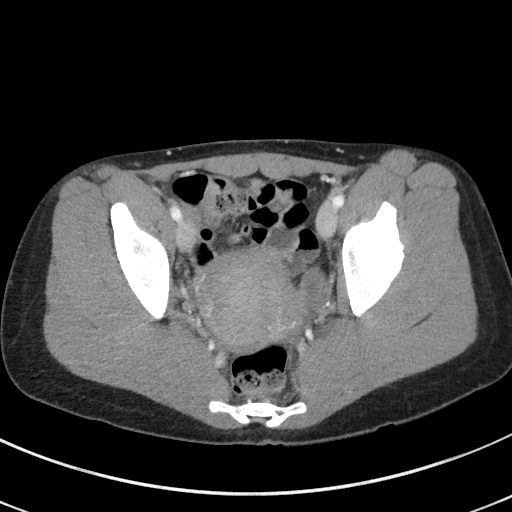
[im 26/75  soft-tissue]
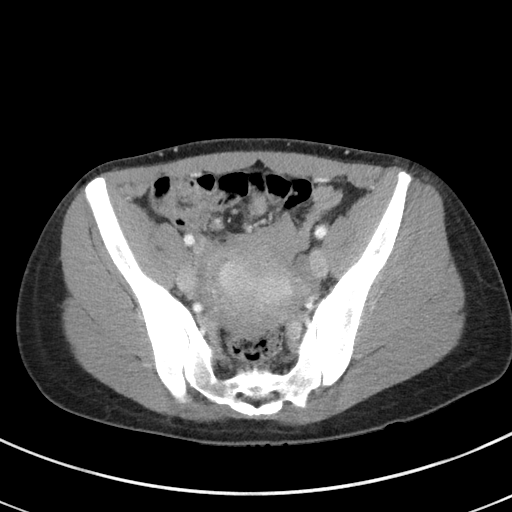
[im 32/75  soft-tissue]
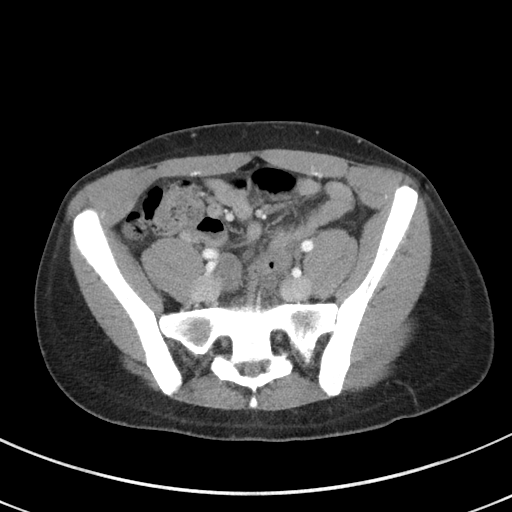
[im 38/75  soft-tissue]
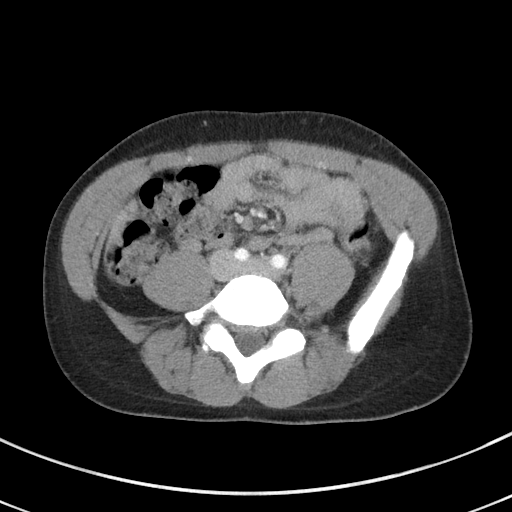
[im 43/75  soft-tissue]
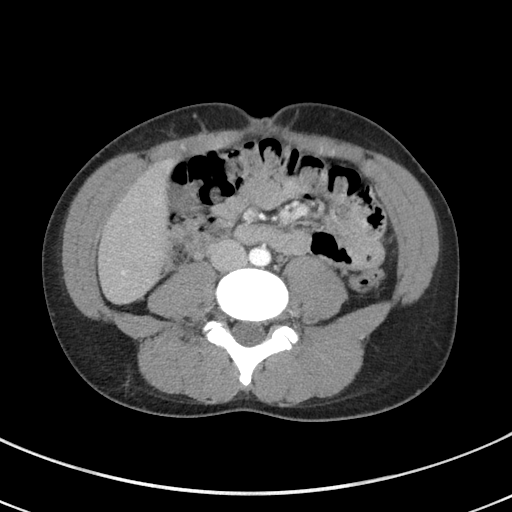
[im 49/75  soft-tissue]
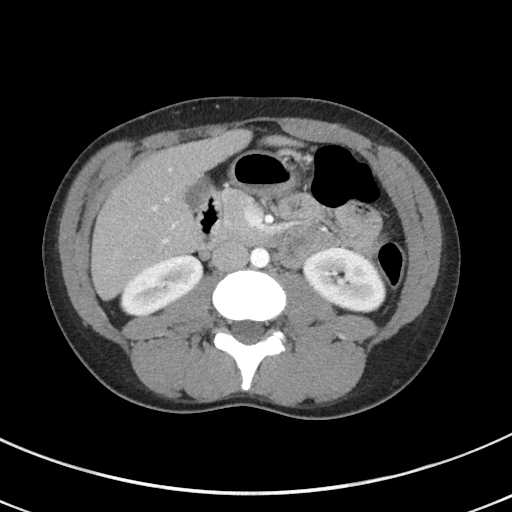
[im 49/75  bone]
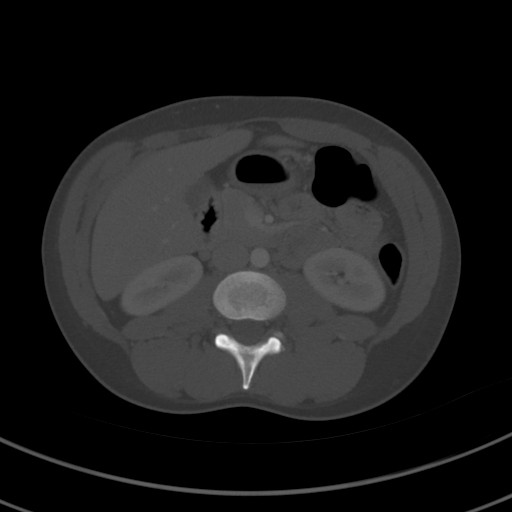
[im 55/75  soft-tissue]
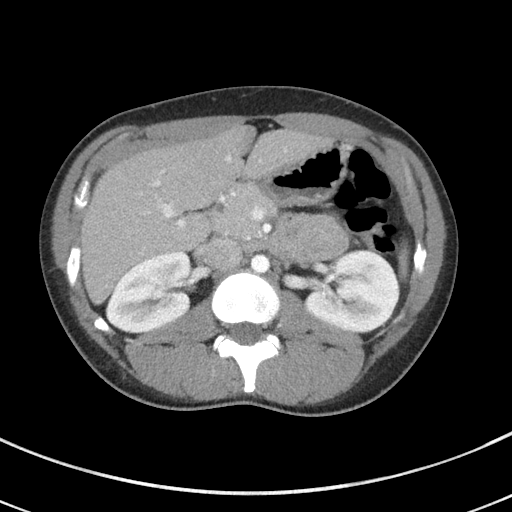
[im 60/75  soft-tissue]
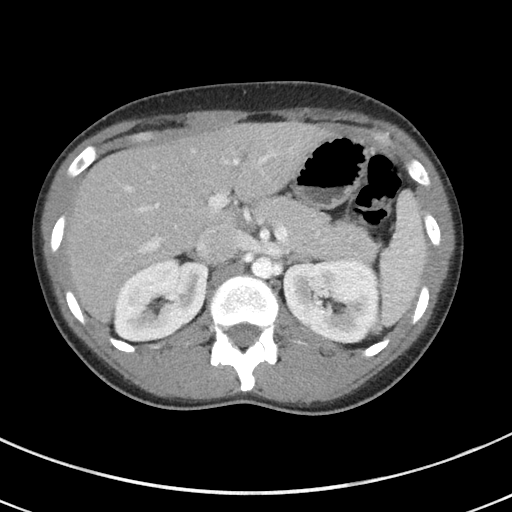
[im 66/75  soft-tissue]
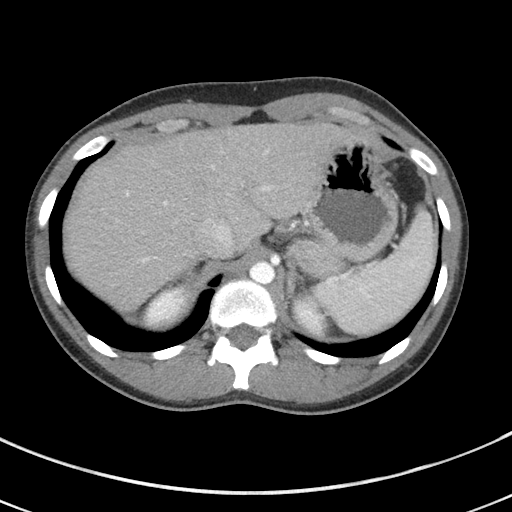
[im 72/75  soft-tissue]
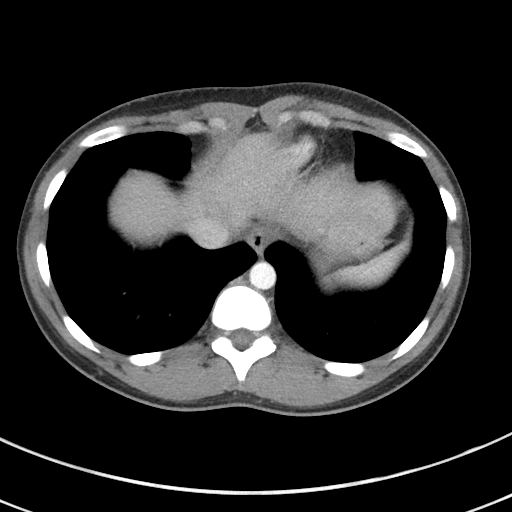

[Series 5: coronal st · coronal · 0.58mm/px · 3 of 69 slices shown]
[im 23/69  soft-tissue]
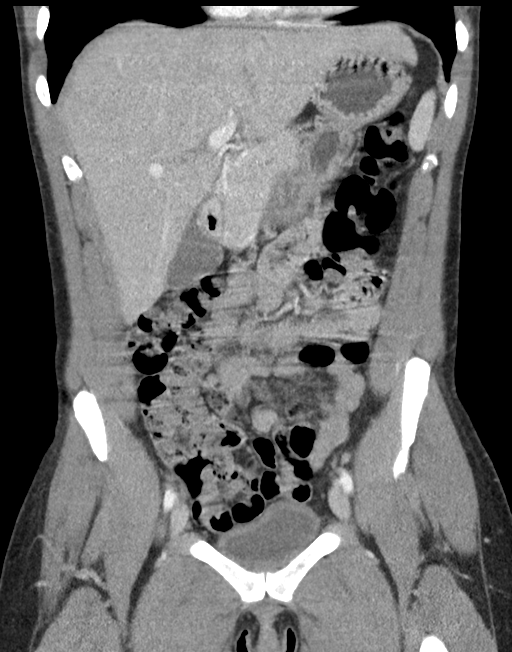
[im 31/69  soft-tissue]
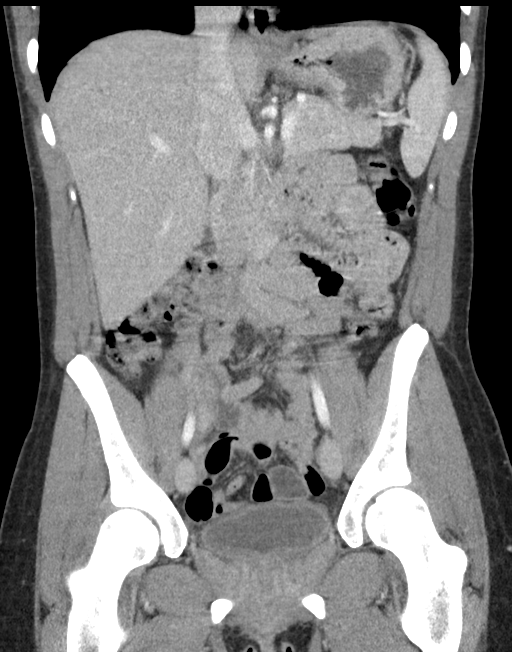
[im 38/69  soft-tissue]
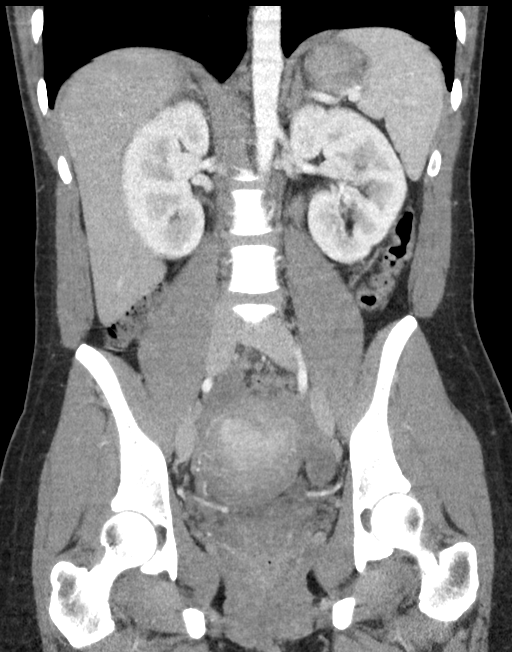

[16 of 46 positions shown; findings below may reference images not displayed]

FINDINGS: Lower chest: No acute abnormality.

Hepatobiliary: Unremarkable appearance of liver. Unremarkable
gallbladder.

Pancreas: Unremarkable pancreas

Spleen: Unremarkable spleen

Adrenals/Urinary Tract: Unremarkable appearance of the adrenal
glands. No evidence of hydronephrosis of the right or left kidney.
No nephrolithiasis. Unremarkable course of the bilateral ureters.
Unremarkable appearance of the urinary bladder.

Stomach/Bowel: Unremarkable appearance of stomach. Unremarkable
small bowel with no abnormal distention. No transition point. No
inflammatory changes. No transition point.

Normal appendix.

Mild stool burden with no transition point. No inflammatory changes
adjacent to the colon.

Vascular/Lymphatic: No significant atherosclerotic disease.
Mesenteric arteries, renal arteries, and the visualized lower
extremity arteries are patent.

No adenopathy

Reproductive: Fluid within the endometrial canal.

Likely physiologic changes associated with the left adnexa. Right
ovary demonstrates 16 mm low-density cystic structure.

Other: No hernia.

Musculoskeletal: No acute displaced fracture.
IMPRESSION: No acute CT finding to account for abdominal/pelvic pain.

Likely physiologic changes of the bilateral adnexa, including a 16
mm right ovarian cyst/follicle. Physiologic changes of the
endometrium.
